# Patient Record
Sex: Male | Born: 1935 | Race: White | Hispanic: No | Marital: Married | Smoking: Former smoker
Health system: Southern US, Community
[De-identification: ages and names within clinical notes are randomized; demographics above are authoritative.]

## PROBLEM LIST (undated history)

## (undated) DIAGNOSIS — F0390 Unspecified dementia without behavioral disturbance: Secondary | ICD-10-CM

## (undated) DIAGNOSIS — I639 Cerebral infarction, unspecified: Secondary | ICD-10-CM

---

## 1998-02-26 ENCOUNTER — Other Ambulatory Visit: Admission: RE | Admit: 1998-02-26 | Discharge: 1998-02-26 | Payer: Self-pay | Admitting: Otolaryngology

## 1998-03-30 ENCOUNTER — Encounter: Payer: Self-pay | Admitting: Interventional Cardiology

## 1998-03-30 ENCOUNTER — Inpatient Hospital Stay (HOSPITAL_COMMUNITY): Admission: AD | Admit: 1998-03-30 | Discharge: 1998-04-01 | Payer: Self-pay | Admitting: Interventional Cardiology

## 1998-05-06 ENCOUNTER — Inpatient Hospital Stay (HOSPITAL_COMMUNITY): Admission: RE | Admit: 1998-05-06 | Discharge: 1998-05-08 | Payer: Self-pay | Admitting: Otolaryngology

## 1998-07-13 ENCOUNTER — Ambulatory Visit (HOSPITAL_COMMUNITY): Admission: RE | Admit: 1998-07-13 | Discharge: 1998-07-13 | Payer: Self-pay | Admitting: Endocrinology

## 1998-07-16 ENCOUNTER — Encounter: Payer: Self-pay | Admitting: Endocrinology

## 1998-12-11 ENCOUNTER — Ambulatory Visit (HOSPITAL_COMMUNITY): Admission: RE | Admit: 1998-12-11 | Discharge: 1998-12-11 | Payer: Self-pay | Admitting: Endocrinology

## 1998-12-14 ENCOUNTER — Encounter: Payer: Self-pay | Admitting: Endocrinology

## 1999-01-25 ENCOUNTER — Encounter (HOSPITAL_BASED_OUTPATIENT_CLINIC_OR_DEPARTMENT_OTHER): Payer: Self-pay | Admitting: Internal Medicine

## 1999-01-25 ENCOUNTER — Ambulatory Visit (HOSPITAL_COMMUNITY): Admission: RE | Admit: 1999-01-25 | Discharge: 1999-01-25 | Payer: Self-pay | Admitting: Internal Medicine

## 2011-07-06 DIAGNOSIS — M5030 Other cervical disc degeneration, unspecified cervical region: Secondary | ICD-10-CM | POA: Diagnosis not present

## 2011-07-06 DIAGNOSIS — E119 Type 2 diabetes mellitus without complications: Secondary | ICD-10-CM | POA: Diagnosis not present

## 2011-07-06 DIAGNOSIS — E785 Hyperlipidemia, unspecified: Secondary | ICD-10-CM | POA: Diagnosis not present

## 2011-07-06 DIAGNOSIS — E039 Hypothyroidism, unspecified: Secondary | ICD-10-CM | POA: Diagnosis not present

## 2011-07-06 DIAGNOSIS — Z6828 Body mass index (BMI) 28.0-28.9, adult: Secondary | ICD-10-CM | POA: Diagnosis not present

## 2011-07-06 DIAGNOSIS — D509 Iron deficiency anemia, unspecified: Secondary | ICD-10-CM | POA: Diagnosis not present

## 2011-07-06 DIAGNOSIS — Z79899 Other long term (current) drug therapy: Secondary | ICD-10-CM | POA: Diagnosis not present

## 2011-10-10 DIAGNOSIS — E785 Hyperlipidemia, unspecified: Secondary | ICD-10-CM | POA: Diagnosis not present

## 2011-10-10 DIAGNOSIS — D509 Iron deficiency anemia, unspecified: Secondary | ICD-10-CM | POA: Diagnosis not present

## 2011-10-10 DIAGNOSIS — E119 Type 2 diabetes mellitus without complications: Secondary | ICD-10-CM | POA: Diagnosis not present

## 2011-10-10 DIAGNOSIS — E039 Hypothyroidism, unspecified: Secondary | ICD-10-CM | POA: Diagnosis not present

## 2011-10-10 DIAGNOSIS — Z79899 Other long term (current) drug therapy: Secondary | ICD-10-CM | POA: Diagnosis not present

## 2011-10-10 DIAGNOSIS — Z6829 Body mass index (BMI) 29.0-29.9, adult: Secondary | ICD-10-CM | POA: Diagnosis not present

## 2011-10-10 DIAGNOSIS — D689 Coagulation defect, unspecified: Secondary | ICD-10-CM | POA: Diagnosis not present

## 2012-01-20 DIAGNOSIS — Z6829 Body mass index (BMI) 29.0-29.9, adult: Secondary | ICD-10-CM | POA: Diagnosis not present

## 2012-01-20 DIAGNOSIS — E039 Hypothyroidism, unspecified: Secondary | ICD-10-CM | POA: Diagnosis not present

## 2012-01-20 DIAGNOSIS — D509 Iron deficiency anemia, unspecified: Secondary | ICD-10-CM | POA: Diagnosis not present

## 2012-01-20 DIAGNOSIS — E785 Hyperlipidemia, unspecified: Secondary | ICD-10-CM | POA: Diagnosis not present

## 2012-01-20 DIAGNOSIS — E119 Type 2 diabetes mellitus without complications: Secondary | ICD-10-CM | POA: Diagnosis not present

## 2012-05-18 DIAGNOSIS — D509 Iron deficiency anemia, unspecified: Secondary | ICD-10-CM | POA: Diagnosis not present

## 2012-05-18 DIAGNOSIS — E119 Type 2 diabetes mellitus without complications: Secondary | ICD-10-CM | POA: Diagnosis not present

## 2012-05-18 DIAGNOSIS — Z6829 Body mass index (BMI) 29.0-29.9, adult: Secondary | ICD-10-CM | POA: Diagnosis not present

## 2012-05-18 DIAGNOSIS — E039 Hypothyroidism, unspecified: Secondary | ICD-10-CM | POA: Diagnosis not present

## 2012-05-18 DIAGNOSIS — E785 Hyperlipidemia, unspecified: Secondary | ICD-10-CM | POA: Diagnosis not present

## 2012-05-28 DIAGNOSIS — Z23 Encounter for immunization: Secondary | ICD-10-CM | POA: Diagnosis not present

## 2012-08-23 DIAGNOSIS — D509 Iron deficiency anemia, unspecified: Secondary | ICD-10-CM | POA: Diagnosis not present

## 2012-08-23 DIAGNOSIS — E785 Hyperlipidemia, unspecified: Secondary | ICD-10-CM | POA: Diagnosis not present

## 2012-08-23 DIAGNOSIS — E039 Hypothyroidism, unspecified: Secondary | ICD-10-CM | POA: Diagnosis not present

## 2012-11-30 DIAGNOSIS — Z1331 Encounter for screening for depression: Secondary | ICD-10-CM | POA: Diagnosis not present

## 2012-11-30 DIAGNOSIS — D509 Iron deficiency anemia, unspecified: Secondary | ICD-10-CM | POA: Diagnosis not present

## 2012-11-30 DIAGNOSIS — Z6827 Body mass index (BMI) 27.0-27.9, adult: Secondary | ICD-10-CM | POA: Diagnosis not present

## 2012-11-30 DIAGNOSIS — E119 Type 2 diabetes mellitus without complications: Secondary | ICD-10-CM | POA: Diagnosis not present

## 2012-11-30 DIAGNOSIS — Z9181 History of falling: Secondary | ICD-10-CM | POA: Diagnosis not present

## 2012-11-30 DIAGNOSIS — E039 Hypothyroidism, unspecified: Secondary | ICD-10-CM | POA: Diagnosis not present

## 2012-11-30 DIAGNOSIS — E785 Hyperlipidemia, unspecified: Secondary | ICD-10-CM | POA: Diagnosis not present

## 2013-03-12 DIAGNOSIS — D509 Iron deficiency anemia, unspecified: Secondary | ICD-10-CM | POA: Diagnosis not present

## 2013-03-12 DIAGNOSIS — I498 Other specified cardiac arrhythmias: Secondary | ICD-10-CM | POA: Diagnosis not present

## 2013-03-12 DIAGNOSIS — Z6828 Body mass index (BMI) 28.0-28.9, adult: Secondary | ICD-10-CM | POA: Diagnosis not present

## 2013-03-12 DIAGNOSIS — E119 Type 2 diabetes mellitus without complications: Secondary | ICD-10-CM | POA: Diagnosis not present

## 2013-03-12 DIAGNOSIS — E039 Hypothyroidism, unspecified: Secondary | ICD-10-CM | POA: Diagnosis not present

## 2013-03-12 DIAGNOSIS — E785 Hyperlipidemia, unspecified: Secondary | ICD-10-CM | POA: Diagnosis not present

## 2013-03-25 DIAGNOSIS — R9431 Abnormal electrocardiogram [ECG] [EKG]: Secondary | ICD-10-CM | POA: Diagnosis not present

## 2013-03-25 DIAGNOSIS — E039 Hypothyroidism, unspecified: Secondary | ICD-10-CM | POA: Diagnosis not present

## 2013-03-25 DIAGNOSIS — E119 Type 2 diabetes mellitus without complications: Secondary | ICD-10-CM | POA: Diagnosis not present

## 2013-03-25 DIAGNOSIS — Z87891 Personal history of nicotine dependence: Secondary | ICD-10-CM | POA: Diagnosis not present

## 2013-03-25 DIAGNOSIS — Z85850 Personal history of malignant neoplasm of thyroid: Secondary | ICD-10-CM | POA: Diagnosis not present

## 2013-03-25 DIAGNOSIS — E785 Hyperlipidemia, unspecified: Secondary | ICD-10-CM | POA: Diagnosis not present

## 2013-03-25 DIAGNOSIS — I10 Essential (primary) hypertension: Secondary | ICD-10-CM | POA: Diagnosis not present

## 2013-03-25 DIAGNOSIS — C73 Malignant neoplasm of thyroid gland: Secondary | ICD-10-CM | POA: Diagnosis not present

## 2013-04-01 DIAGNOSIS — R9431 Abnormal electrocardiogram [ECG] [EKG]: Secondary | ICD-10-CM | POA: Diagnosis not present

## 2013-04-24 DIAGNOSIS — E119 Type 2 diabetes mellitus without complications: Secondary | ICD-10-CM | POA: Diagnosis not present

## 2013-04-24 DIAGNOSIS — I10 Essential (primary) hypertension: Secondary | ICD-10-CM | POA: Diagnosis not present

## 2013-04-24 DIAGNOSIS — E785 Hyperlipidemia, unspecified: Secondary | ICD-10-CM | POA: Diagnosis not present

## 2013-04-25 DIAGNOSIS — Z23 Encounter for immunization: Secondary | ICD-10-CM | POA: Diagnosis not present

## 2013-05-25 DIAGNOSIS — G4733 Obstructive sleep apnea (adult) (pediatric): Secondary | ICD-10-CM | POA: Diagnosis not present

## 2013-06-25 DIAGNOSIS — E785 Hyperlipidemia, unspecified: Secondary | ICD-10-CM | POA: Diagnosis not present

## 2013-06-25 DIAGNOSIS — E119 Type 2 diabetes mellitus without complications: Secondary | ICD-10-CM | POA: Diagnosis not present

## 2013-06-25 DIAGNOSIS — D509 Iron deficiency anemia, unspecified: Secondary | ICD-10-CM | POA: Diagnosis not present

## 2013-06-25 DIAGNOSIS — E039 Hypothyroidism, unspecified: Secondary | ICD-10-CM | POA: Diagnosis not present

## 2013-06-25 DIAGNOSIS — Z6828 Body mass index (BMI) 28.0-28.9, adult: Secondary | ICD-10-CM | POA: Diagnosis not present

## 2013-08-19 DIAGNOSIS — E890 Postprocedural hypothyroidism: Secondary | ICD-10-CM | POA: Diagnosis not present

## 2013-08-19 DIAGNOSIS — J209 Acute bronchitis, unspecified: Secondary | ICD-10-CM | POA: Diagnosis not present

## 2013-08-19 DIAGNOSIS — Z6828 Body mass index (BMI) 28.0-28.9, adult: Secondary | ICD-10-CM | POA: Diagnosis not present

## 2013-08-22 DIAGNOSIS — Z6828 Body mass index (BMI) 28.0-28.9, adult: Secondary | ICD-10-CM | POA: Diagnosis not present

## 2013-08-22 DIAGNOSIS — D509 Iron deficiency anemia, unspecified: Secondary | ICD-10-CM | POA: Diagnosis not present

## 2013-08-22 DIAGNOSIS — E785 Hyperlipidemia, unspecified: Secondary | ICD-10-CM | POA: Diagnosis not present

## 2013-08-22 DIAGNOSIS — E119 Type 2 diabetes mellitus without complications: Secondary | ICD-10-CM | POA: Diagnosis not present

## 2013-08-22 DIAGNOSIS — E890 Postprocedural hypothyroidism: Secondary | ICD-10-CM | POA: Diagnosis not present

## 2013-11-29 DIAGNOSIS — D509 Iron deficiency anemia, unspecified: Secondary | ICD-10-CM | POA: Diagnosis not present

## 2013-11-29 DIAGNOSIS — E785 Hyperlipidemia, unspecified: Secondary | ICD-10-CM | POA: Diagnosis not present

## 2013-11-29 DIAGNOSIS — E119 Type 2 diabetes mellitus without complications: Secondary | ICD-10-CM | POA: Diagnosis not present

## 2013-11-29 DIAGNOSIS — E890 Postprocedural hypothyroidism: Secondary | ICD-10-CM | POA: Diagnosis not present

## 2013-11-29 DIAGNOSIS — Z6827 Body mass index (BMI) 27.0-27.9, adult: Secondary | ICD-10-CM | POA: Diagnosis not present

## 2014-02-27 DIAGNOSIS — H40019 Open angle with borderline findings, low risk, unspecified eye: Secondary | ICD-10-CM | POA: Diagnosis not present

## 2014-03-05 DIAGNOSIS — D509 Iron deficiency anemia, unspecified: Secondary | ICD-10-CM | POA: Diagnosis not present

## 2014-03-05 DIAGNOSIS — E890 Postprocedural hypothyroidism: Secondary | ICD-10-CM | POA: Diagnosis not present

## 2014-03-05 DIAGNOSIS — Z6827 Body mass index (BMI) 27.0-27.9, adult: Secondary | ICD-10-CM | POA: Diagnosis not present

## 2014-03-05 DIAGNOSIS — Z9181 History of falling: Secondary | ICD-10-CM | POA: Diagnosis not present

## 2014-03-05 DIAGNOSIS — E119 Type 2 diabetes mellitus without complications: Secondary | ICD-10-CM | POA: Diagnosis not present

## 2014-03-05 DIAGNOSIS — E785 Hyperlipidemia, unspecified: Secondary | ICD-10-CM | POA: Diagnosis not present

## 2014-04-25 DIAGNOSIS — Z23 Encounter for immunization: Secondary | ICD-10-CM | POA: Diagnosis not present

## 2014-06-06 DIAGNOSIS — E119 Type 2 diabetes mellitus without complications: Secondary | ICD-10-CM | POA: Diagnosis not present

## 2014-06-06 DIAGNOSIS — E890 Postprocedural hypothyroidism: Secondary | ICD-10-CM | POA: Diagnosis not present

## 2014-06-06 DIAGNOSIS — Z6827 Body mass index (BMI) 27.0-27.9, adult: Secondary | ICD-10-CM | POA: Diagnosis not present

## 2014-06-06 DIAGNOSIS — E785 Hyperlipidemia, unspecified: Secondary | ICD-10-CM | POA: Diagnosis not present

## 2014-06-06 DIAGNOSIS — E611 Iron deficiency: Secondary | ICD-10-CM | POA: Diagnosis not present

## 2014-09-09 DIAGNOSIS — E785 Hyperlipidemia, unspecified: Secondary | ICD-10-CM | POA: Diagnosis not present

## 2014-09-09 DIAGNOSIS — E119 Type 2 diabetes mellitus without complications: Secondary | ICD-10-CM | POA: Diagnosis not present

## 2014-09-09 DIAGNOSIS — E611 Iron deficiency: Secondary | ICD-10-CM | POA: Diagnosis not present

## 2014-09-09 DIAGNOSIS — E890 Postprocedural hypothyroidism: Secondary | ICD-10-CM | POA: Diagnosis not present

## 2014-09-09 DIAGNOSIS — Z6828 Body mass index (BMI) 28.0-28.9, adult: Secondary | ICD-10-CM | POA: Diagnosis not present

## 2014-12-15 DIAGNOSIS — Z6828 Body mass index (BMI) 28.0-28.9, adult: Secondary | ICD-10-CM | POA: Diagnosis not present

## 2014-12-15 DIAGNOSIS — E119 Type 2 diabetes mellitus without complications: Secondary | ICD-10-CM | POA: Diagnosis not present

## 2014-12-15 DIAGNOSIS — E890 Postprocedural hypothyroidism: Secondary | ICD-10-CM | POA: Diagnosis not present

## 2014-12-15 DIAGNOSIS — E611 Iron deficiency: Secondary | ICD-10-CM | POA: Diagnosis not present

## 2014-12-15 DIAGNOSIS — E785 Hyperlipidemia, unspecified: Secondary | ICD-10-CM | POA: Diagnosis not present

## 2015-03-19 DIAGNOSIS — E890 Postprocedural hypothyroidism: Secondary | ICD-10-CM | POA: Diagnosis not present

## 2015-03-19 DIAGNOSIS — D509 Iron deficiency anemia, unspecified: Secondary | ICD-10-CM | POA: Diagnosis not present

## 2015-03-19 DIAGNOSIS — E119 Type 2 diabetes mellitus without complications: Secondary | ICD-10-CM | POA: Diagnosis not present

## 2015-03-19 DIAGNOSIS — H938X2 Other specified disorders of left ear: Secondary | ICD-10-CM | POA: Diagnosis not present

## 2015-03-19 DIAGNOSIS — E785 Hyperlipidemia, unspecified: Secondary | ICD-10-CM | POA: Diagnosis not present

## 2015-03-19 DIAGNOSIS — Z23 Encounter for immunization: Secondary | ICD-10-CM | POA: Diagnosis not present

## 2015-03-19 DIAGNOSIS — H6122 Impacted cerumen, left ear: Secondary | ICD-10-CM | POA: Diagnosis not present

## 2015-05-29 DIAGNOSIS — M25512 Pain in left shoulder: Secondary | ICD-10-CM | POA: Diagnosis not present

## 2015-06-06 DIAGNOSIS — M25512 Pain in left shoulder: Secondary | ICD-10-CM | POA: Diagnosis not present

## 2015-06-06 DIAGNOSIS — M75102 Unspecified rotator cuff tear or rupture of left shoulder, not specified as traumatic: Secondary | ICD-10-CM | POA: Diagnosis not present

## 2015-06-10 DIAGNOSIS — M12812 Other specific arthropathies, not elsewhere classified, left shoulder: Secondary | ICD-10-CM | POA: Diagnosis not present

## 2015-06-10 DIAGNOSIS — M25512 Pain in left shoulder: Secondary | ICD-10-CM | POA: Diagnosis not present

## 2015-06-23 DIAGNOSIS — D509 Iron deficiency anemia, unspecified: Secondary | ICD-10-CM | POA: Diagnosis not present

## 2015-06-23 DIAGNOSIS — E890 Postprocedural hypothyroidism: Secondary | ICD-10-CM | POA: Diagnosis not present

## 2015-06-23 DIAGNOSIS — Z9181 History of falling: Secondary | ICD-10-CM | POA: Diagnosis not present

## 2015-06-23 DIAGNOSIS — E119 Type 2 diabetes mellitus without complications: Secondary | ICD-10-CM | POA: Diagnosis not present

## 2015-06-23 DIAGNOSIS — Z23 Encounter for immunization: Secondary | ICD-10-CM | POA: Diagnosis not present

## 2015-06-23 DIAGNOSIS — E785 Hyperlipidemia, unspecified: Secondary | ICD-10-CM | POA: Diagnosis not present

## 2015-06-23 DIAGNOSIS — Z1389 Encounter for screening for other disorder: Secondary | ICD-10-CM | POA: Diagnosis not present

## 2015-09-22 DIAGNOSIS — E785 Hyperlipidemia, unspecified: Secondary | ICD-10-CM | POA: Diagnosis not present

## 2015-09-22 DIAGNOSIS — D509 Iron deficiency anemia, unspecified: Secondary | ICD-10-CM | POA: Diagnosis not present

## 2015-09-22 DIAGNOSIS — E663 Overweight: Secondary | ICD-10-CM | POA: Diagnosis not present

## 2015-09-22 DIAGNOSIS — E890 Postprocedural hypothyroidism: Secondary | ICD-10-CM | POA: Diagnosis not present

## 2015-09-22 DIAGNOSIS — Z6828 Body mass index (BMI) 28.0-28.9, adult: Secondary | ICD-10-CM | POA: Diagnosis not present

## 2015-09-22 DIAGNOSIS — E119 Type 2 diabetes mellitus without complications: Secondary | ICD-10-CM | POA: Diagnosis not present

## 2015-09-22 DIAGNOSIS — H6123 Impacted cerumen, bilateral: Secondary | ICD-10-CM | POA: Diagnosis not present

## 2015-11-03 DIAGNOSIS — H40013 Open angle with borderline findings, low risk, bilateral: Secondary | ICD-10-CM | POA: Diagnosis not present

## 2015-11-03 DIAGNOSIS — H5203 Hypermetropia, bilateral: Secondary | ICD-10-CM | POA: Diagnosis not present

## 2015-12-23 DIAGNOSIS — E785 Hyperlipidemia, unspecified: Secondary | ICD-10-CM | POA: Diagnosis not present

## 2015-12-23 DIAGNOSIS — D509 Iron deficiency anemia, unspecified: Secondary | ICD-10-CM | POA: Diagnosis not present

## 2015-12-23 DIAGNOSIS — E890 Postprocedural hypothyroidism: Secondary | ICD-10-CM | POA: Diagnosis not present

## 2015-12-23 DIAGNOSIS — Z6827 Body mass index (BMI) 27.0-27.9, adult: Secondary | ICD-10-CM | POA: Diagnosis not present

## 2015-12-23 DIAGNOSIS — E119 Type 2 diabetes mellitus without complications: Secondary | ICD-10-CM | POA: Diagnosis not present

## 2016-01-22 DIAGNOSIS — E890 Postprocedural hypothyroidism: Secondary | ICD-10-CM | POA: Diagnosis not present

## 2016-03-29 DIAGNOSIS — R0602 Shortness of breath: Secondary | ICD-10-CM | POA: Diagnosis not present

## 2016-03-29 DIAGNOSIS — E785 Hyperlipidemia, unspecified: Secondary | ICD-10-CM | POA: Diagnosis not present

## 2016-03-29 DIAGNOSIS — Z6828 Body mass index (BMI) 28.0-28.9, adult: Secondary | ICD-10-CM | POA: Diagnosis not present

## 2016-03-29 DIAGNOSIS — I700 Atherosclerosis of aorta: Secondary | ICD-10-CM | POA: Diagnosis not present

## 2016-03-29 DIAGNOSIS — R05 Cough: Secondary | ICD-10-CM | POA: Diagnosis not present

## 2016-03-29 DIAGNOSIS — K449 Diaphragmatic hernia without obstruction or gangrene: Secondary | ICD-10-CM | POA: Diagnosis not present

## 2016-03-29 DIAGNOSIS — E119 Type 2 diabetes mellitus without complications: Secondary | ICD-10-CM | POA: Diagnosis not present

## 2016-03-29 DIAGNOSIS — J90 Pleural effusion, not elsewhere classified: Secondary | ICD-10-CM | POA: Diagnosis not present

## 2016-03-29 DIAGNOSIS — I509 Heart failure, unspecified: Secondary | ICD-10-CM | POA: Diagnosis not present

## 2016-03-29 DIAGNOSIS — J449 Chronic obstructive pulmonary disease, unspecified: Secondary | ICD-10-CM | POA: Diagnosis not present

## 2016-03-29 DIAGNOSIS — D509 Iron deficiency anemia, unspecified: Secondary | ICD-10-CM | POA: Diagnosis not present

## 2016-03-29 DIAGNOSIS — Z23 Encounter for immunization: Secondary | ICD-10-CM | POA: Diagnosis not present

## 2016-03-29 DIAGNOSIS — E890 Postprocedural hypothyroidism: Secondary | ICD-10-CM | POA: Diagnosis not present

## 2016-03-29 DIAGNOSIS — G309 Alzheimer's disease, unspecified: Secondary | ICD-10-CM | POA: Diagnosis not present

## 2016-03-29 DIAGNOSIS — E663 Overweight: Secondary | ICD-10-CM | POA: Diagnosis not present

## 2016-03-30 DIAGNOSIS — M12812 Other specific arthropathies, not elsewhere classified, left shoulder: Secondary | ICD-10-CM | POA: Diagnosis not present

## 2016-03-30 DIAGNOSIS — M25512 Pain in left shoulder: Secondary | ICD-10-CM | POA: Diagnosis not present

## 2016-04-06 DIAGNOSIS — G309 Alzheimer's disease, unspecified: Secondary | ICD-10-CM | POA: Diagnosis not present

## 2016-04-06 DIAGNOSIS — I5031 Acute diastolic (congestive) heart failure: Secondary | ICD-10-CM | POA: Diagnosis not present

## 2016-04-06 DIAGNOSIS — I639 Cerebral infarction, unspecified: Secondary | ICD-10-CM | POA: Diagnosis not present

## 2016-04-19 DIAGNOSIS — J4520 Mild intermittent asthma, uncomplicated: Secondary | ICD-10-CM | POA: Diagnosis not present

## 2016-04-19 DIAGNOSIS — R5383 Other fatigue: Secondary | ICD-10-CM | POA: Diagnosis not present

## 2016-04-19 DIAGNOSIS — G4733 Obstructive sleep apnea (adult) (pediatric): Secondary | ICD-10-CM | POA: Diagnosis not present

## 2016-04-20 DIAGNOSIS — G4733 Obstructive sleep apnea (adult) (pediatric): Secondary | ICD-10-CM | POA: Diagnosis not present

## 2016-04-25 DIAGNOSIS — G4733 Obstructive sleep apnea (adult) (pediatric): Secondary | ICD-10-CM | POA: Diagnosis not present

## 2016-04-25 DIAGNOSIS — R5383 Other fatigue: Secondary | ICD-10-CM | POA: Diagnosis not present

## 2016-04-25 DIAGNOSIS — J4520 Mild intermittent asthma, uncomplicated: Secondary | ICD-10-CM | POA: Diagnosis not present

## 2016-04-26 DIAGNOSIS — I5022 Chronic systolic (congestive) heart failure: Secondary | ICD-10-CM | POA: Diagnosis not present

## 2016-04-26 DIAGNOSIS — E559 Vitamin D deficiency, unspecified: Secondary | ICD-10-CM | POA: Diagnosis not present

## 2016-04-26 DIAGNOSIS — I639 Cerebral infarction, unspecified: Secondary | ICD-10-CM | POA: Diagnosis not present

## 2016-04-26 DIAGNOSIS — G309 Alzheimer's disease, unspecified: Secondary | ICD-10-CM | POA: Diagnosis not present

## 2016-04-26 DIAGNOSIS — I4891 Unspecified atrial fibrillation: Secondary | ICD-10-CM | POA: Diagnosis not present

## 2016-04-26 DIAGNOSIS — K449 Diaphragmatic hernia without obstruction or gangrene: Secondary | ICD-10-CM | POA: Diagnosis not present

## 2016-04-26 DIAGNOSIS — I619 Nontraumatic intracerebral hemorrhage, unspecified: Secondary | ICD-10-CM | POA: Diagnosis not present

## 2016-04-26 DIAGNOSIS — G4733 Obstructive sleep apnea (adult) (pediatric): Secondary | ICD-10-CM | POA: Diagnosis not present

## 2016-05-03 DIAGNOSIS — I509 Heart failure, unspecified: Secondary | ICD-10-CM | POA: Diagnosis not present

## 2016-05-03 DIAGNOSIS — I619 Nontraumatic intracerebral hemorrhage, unspecified: Secondary | ICD-10-CM | POA: Diagnosis not present

## 2016-05-03 DIAGNOSIS — I4891 Unspecified atrial fibrillation: Secondary | ICD-10-CM | POA: Diagnosis not present

## 2016-05-03 DIAGNOSIS — E538 Deficiency of other specified B group vitamins: Secondary | ICD-10-CM | POA: Diagnosis not present

## 2016-05-03 DIAGNOSIS — G4733 Obstructive sleep apnea (adult) (pediatric): Secondary | ICD-10-CM | POA: Diagnosis not present

## 2016-05-03 DIAGNOSIS — R413 Other amnesia: Secondary | ICD-10-CM | POA: Diagnosis not present

## 2016-05-16 DIAGNOSIS — I619 Nontraumatic intracerebral hemorrhage, unspecified: Secondary | ICD-10-CM | POA: Diagnosis not present

## 2016-05-16 DIAGNOSIS — I63233 Cerebral infarction due to unspecified occlusion or stenosis of bilateral carotid arteries: Secondary | ICD-10-CM | POA: Diagnosis not present

## 2016-05-16 DIAGNOSIS — I6523 Occlusion and stenosis of bilateral carotid arteries: Secondary | ICD-10-CM | POA: Diagnosis not present

## 2016-07-20 DIAGNOSIS — M25512 Pain in left shoulder: Secondary | ICD-10-CM | POA: Diagnosis not present

## 2016-07-20 DIAGNOSIS — M12812 Other specific arthropathies, not elsewhere classified, left shoulder: Secondary | ICD-10-CM | POA: Diagnosis not present

## 2016-07-27 DIAGNOSIS — D509 Iron deficiency anemia, unspecified: Secondary | ICD-10-CM | POA: Diagnosis not present

## 2016-07-27 DIAGNOSIS — E119 Type 2 diabetes mellitus without complications: Secondary | ICD-10-CM | POA: Diagnosis not present

## 2016-07-27 DIAGNOSIS — I639 Cerebral infarction, unspecified: Secondary | ICD-10-CM | POA: Diagnosis not present

## 2016-07-27 DIAGNOSIS — I4891 Unspecified atrial fibrillation: Secondary | ICD-10-CM | POA: Diagnosis not present

## 2016-07-27 DIAGNOSIS — Z1389 Encounter for screening for other disorder: Secondary | ICD-10-CM | POA: Diagnosis not present

## 2016-07-27 DIAGNOSIS — E890 Postprocedural hypothyroidism: Secondary | ICD-10-CM | POA: Diagnosis not present

## 2016-07-27 DIAGNOSIS — E785 Hyperlipidemia, unspecified: Secondary | ICD-10-CM | POA: Diagnosis not present

## 2016-07-27 DIAGNOSIS — Z9181 History of falling: Secondary | ICD-10-CM | POA: Diagnosis not present

## 2016-08-09 DIAGNOSIS — J4520 Mild intermittent asthma, uncomplicated: Secondary | ICD-10-CM | POA: Diagnosis not present

## 2016-08-09 DIAGNOSIS — R5383 Other fatigue: Secondary | ICD-10-CM | POA: Diagnosis not present

## 2016-08-09 DIAGNOSIS — G4733 Obstructive sleep apnea (adult) (pediatric): Secondary | ICD-10-CM | POA: Diagnosis not present

## 2016-09-01 DIAGNOSIS — E890 Postprocedural hypothyroidism: Secondary | ICD-10-CM | POA: Diagnosis not present

## 2016-09-16 DIAGNOSIS — R4182 Altered mental status, unspecified: Secondary | ICD-10-CM | POA: Diagnosis not present

## 2016-09-16 DIAGNOSIS — F0390 Unspecified dementia without behavioral disturbance: Secondary | ICD-10-CM | POA: Diagnosis not present

## 2016-09-16 DIAGNOSIS — I517 Cardiomegaly: Secondary | ICD-10-CM | POA: Diagnosis not present

## 2016-09-20 ENCOUNTER — Emergency Department (HOSPITAL_COMMUNITY): Payer: Medicare Other

## 2016-09-20 ENCOUNTER — Inpatient Hospital Stay (HOSPITAL_COMMUNITY): Payer: Medicare Other

## 2016-09-20 ENCOUNTER — Inpatient Hospital Stay (HOSPITAL_COMMUNITY)
Admission: EM | Admit: 2016-09-20 | Discharge: 2016-09-22 | DRG: 101 | Disposition: A | Discharge status: Skilled Nursing Facility | Payer: Medicare Other | Attending: Internal Medicine | Admitting: Internal Medicine

## 2016-09-20 ENCOUNTER — Encounter (HOSPITAL_COMMUNITY): Payer: Self-pay

## 2016-09-20 DIAGNOSIS — Z7984 Long term (current) use of oral hypoglycemic drugs: Secondary | ICD-10-CM

## 2016-09-20 DIAGNOSIS — Z8639 Personal history of other endocrine, nutritional and metabolic disease: Secondary | ICD-10-CM | POA: Diagnosis not present

## 2016-09-20 DIAGNOSIS — I482 Chronic atrial fibrillation, unspecified: Secondary | ICD-10-CM | POA: Diagnosis present

## 2016-09-20 DIAGNOSIS — Z515 Encounter for palliative care: Secondary | ICD-10-CM | POA: Diagnosis not present

## 2016-09-20 DIAGNOSIS — R4781 Slurred speech: Secondary | ICD-10-CM

## 2016-09-20 DIAGNOSIS — R748 Abnormal levels of other serum enzymes: Secondary | ICD-10-CM | POA: Diagnosis present

## 2016-09-20 DIAGNOSIS — Z8673 Personal history of transient ischemic attack (TIA), and cerebral infarction without residual deficits: Secondary | ICD-10-CM

## 2016-09-20 DIAGNOSIS — I517 Cardiomegaly: Secondary | ICD-10-CM | POA: Diagnosis not present

## 2016-09-20 DIAGNOSIS — I6789 Other cerebrovascular disease: Secondary | ICD-10-CM | POA: Diagnosis not present

## 2016-09-20 DIAGNOSIS — Z87891 Personal history of nicotine dependence: Secondary | ICD-10-CM | POA: Diagnosis not present

## 2016-09-20 DIAGNOSIS — W19XXXA Unspecified fall, initial encounter: Secondary | ICD-10-CM

## 2016-09-20 DIAGNOSIS — G8194 Hemiplegia, unspecified affecting left nondominant side: Secondary | ICD-10-CM | POA: Diagnosis not present

## 2016-09-20 DIAGNOSIS — Z87898 Personal history of other specified conditions: Secondary | ICD-10-CM | POA: Diagnosis not present

## 2016-09-20 DIAGNOSIS — S199XXA Unspecified injury of neck, initial encounter: Secondary | ICD-10-CM | POA: Diagnosis not present

## 2016-09-20 DIAGNOSIS — E876 Hypokalemia: Secondary | ICD-10-CM | POA: Diagnosis present

## 2016-09-20 DIAGNOSIS — T1490XA Injury, unspecified, initial encounter: Secondary | ICD-10-CM | POA: Diagnosis present

## 2016-09-20 DIAGNOSIS — E119 Type 2 diabetes mellitus without complications: Secondary | ICD-10-CM | POA: Diagnosis present

## 2016-09-20 DIAGNOSIS — R569 Unspecified convulsions: Principal | ICD-10-CM | POA: Diagnosis present

## 2016-09-20 DIAGNOSIS — G9340 Encephalopathy, unspecified: Secondary | ICD-10-CM | POA: Diagnosis not present

## 2016-09-20 DIAGNOSIS — M6282 Rhabdomyolysis: Secondary | ICD-10-CM | POA: Diagnosis present

## 2016-09-20 DIAGNOSIS — E860 Dehydration: Secondary | ICD-10-CM | POA: Diagnosis present

## 2016-09-20 DIAGNOSIS — R471 Dysarthria and anarthria: Secondary | ICD-10-CM | POA: Diagnosis not present

## 2016-09-20 DIAGNOSIS — E118 Type 2 diabetes mellitus with unspecified complications: Secondary | ICD-10-CM | POA: Diagnosis not present

## 2016-09-20 DIAGNOSIS — S3993XA Unspecified injury of pelvis, initial encounter: Secondary | ICD-10-CM | POA: Diagnosis not present

## 2016-09-20 DIAGNOSIS — R451 Restlessness and agitation: Secondary | ICD-10-CM

## 2016-09-20 DIAGNOSIS — K117 Disturbances of salivary secretion: Secondary | ICD-10-CM

## 2016-09-20 DIAGNOSIS — F0390 Unspecified dementia without behavioral disturbance: Secondary | ICD-10-CM

## 2016-09-20 DIAGNOSIS — Z9181 History of falling: Secondary | ICD-10-CM

## 2016-09-20 DIAGNOSIS — R441 Visual hallucinations: Secondary | ICD-10-CM

## 2016-09-20 DIAGNOSIS — R531 Weakness: Secondary | ICD-10-CM | POA: Diagnosis not present

## 2016-09-20 DIAGNOSIS — Z79899 Other long term (current) drug therapy: Secondary | ICD-10-CM

## 2016-09-20 DIAGNOSIS — F0281 Dementia in other diseases classified elsewhere with behavioral disturbance: Secondary | ICD-10-CM | POA: Diagnosis not present

## 2016-09-20 DIAGNOSIS — Z66 Do not resuscitate: Secondary | ICD-10-CM | POA: Diagnosis present

## 2016-09-20 DIAGNOSIS — S299XXA Unspecified injury of thorax, initial encounter: Secondary | ICD-10-CM | POA: Diagnosis not present

## 2016-09-20 DIAGNOSIS — S0990XA Unspecified injury of head, initial encounter: Secondary | ICD-10-CM | POA: Diagnosis not present

## 2016-09-20 DIAGNOSIS — R4182 Altered mental status, unspecified: Secondary | ICD-10-CM | POA: Diagnosis not present

## 2016-09-20 DIAGNOSIS — Z8679 Personal history of other diseases of the circulatory system: Secondary | ICD-10-CM | POA: Diagnosis not present

## 2016-09-20 DIAGNOSIS — R55 Syncope and collapse: Secondary | ICD-10-CM

## 2016-09-20 HISTORY — DX: Cerebral infarction, unspecified: I63.9

## 2016-09-20 HISTORY — DX: Unspecified dementia, unspecified severity, without behavioral disturbance, psychotic disturbance, mood disturbance, and anxiety: F03.90

## 2016-09-20 LAB — CK: CK TOTAL: 509 U/L — AB (ref 49–397)

## 2016-09-20 LAB — DIFFERENTIAL
BASOS PCT: 0 %
Basophils Absolute: 0 10*3/uL (ref 0.0–0.1)
EOS PCT: 0 %
Eosinophils Absolute: 0 10*3/uL (ref 0.0–0.7)
Lymphocytes Relative: 13 %
Lymphs Abs: 1 10*3/uL (ref 0.7–4.0)
MONO ABS: 0.6 10*3/uL (ref 0.1–1.0)
MONOS PCT: 8 %
Neutro Abs: 5.9 10*3/uL (ref 1.7–7.7)
Neutrophils Relative %: 79 %

## 2016-09-20 LAB — COMPREHENSIVE METABOLIC PANEL
ALT: 12 U/L — AB (ref 17–63)
ANION GAP: 9 (ref 5–15)
AST: 27 U/L (ref 15–41)
Albumin: 3.9 g/dL (ref 3.5–5.0)
Alkaline Phosphatase: 46 U/L (ref 38–126)
BUN: 10 mg/dL (ref 6–20)
CALCIUM: 8.9 mg/dL (ref 8.9–10.3)
CHLORIDE: 104 mmol/L (ref 101–111)
CO2: 29 mmol/L (ref 22–32)
CREATININE: 0.84 mg/dL (ref 0.61–1.24)
Glucose, Bld: 112 mg/dL — ABNORMAL HIGH (ref 65–99)
Potassium: 3.5 mmol/L (ref 3.5–5.1)
SODIUM: 142 mmol/L (ref 135–145)
Total Bilirubin: 1.8 mg/dL — ABNORMAL HIGH (ref 0.3–1.2)
Total Protein: 6.2 g/dL — ABNORMAL LOW (ref 6.5–8.1)

## 2016-09-20 LAB — ETHANOL

## 2016-09-20 LAB — URINALYSIS, ROUTINE W REFLEX MICROSCOPIC
BACTERIA UA: NONE SEEN
BILIRUBIN URINE: NEGATIVE
Glucose, UA: NEGATIVE mg/dL
KETONES UR: 5 mg/dL — AB
Leukocytes, UA: NEGATIVE
NITRITE: NEGATIVE
PROTEIN: 30 mg/dL — AB
SQUAMOUS EPITHELIAL / LPF: NONE SEEN
Specific Gravity, Urine: 1.017 (ref 1.005–1.030)
pH: 6 (ref 5.0–8.0)

## 2016-09-20 LAB — I-STAT CHEM 8, ED
BUN: 11 mg/dL (ref 6–20)
CALCIUM ION: 1.07 mmol/L — AB (ref 1.15–1.40)
CHLORIDE: 100 mmol/L — AB (ref 101–111)
Creatinine, Ser: 0.7 mg/dL (ref 0.61–1.24)
GLUCOSE: 107 mg/dL — AB (ref 65–99)
HCT: 42 % (ref 39.0–52.0)
HEMOGLOBIN: 14.3 g/dL (ref 13.0–17.0)
Potassium: 3.4 mmol/L — ABNORMAL LOW (ref 3.5–5.1)
SODIUM: 142 mmol/L (ref 135–145)
TCO2: 30 mmol/L (ref 0–100)

## 2016-09-20 LAB — CBC
HEMATOCRIT: 43.4 % (ref 39.0–52.0)
Hemoglobin: 14.4 g/dL (ref 13.0–17.0)
MCH: 31.2 pg (ref 26.0–34.0)
MCHC: 33.2 g/dL (ref 30.0–36.0)
MCV: 93.9 fL (ref 78.0–100.0)
PLATELETS: 147 10*3/uL — AB (ref 150–400)
RBC: 4.62 MIL/uL (ref 4.22–5.81)
RDW: 14 % (ref 11.5–15.5)
WBC: 7.5 10*3/uL (ref 4.0–10.5)

## 2016-09-20 LAB — RAPID URINE DRUG SCREEN, HOSP PERFORMED
AMPHETAMINES: NOT DETECTED
BENZODIAZEPINES: NOT DETECTED
Barbiturates: NOT DETECTED
Cocaine: NOT DETECTED
Opiates: NOT DETECTED
Tetrahydrocannabinol: NOT DETECTED

## 2016-09-20 LAB — I-STAT CG4 LACTIC ACID, ED: Lactic Acid, Venous: 1.59 mmol/L (ref 0.5–1.9)

## 2016-09-20 LAB — PROTIME-INR
INR: 1.45
PROTHROMBIN TIME: 17.8 s — AB (ref 11.4–15.2)

## 2016-09-20 LAB — I-STAT TROPONIN, ED: TROPONIN I, POC: 0.04 ng/mL (ref 0.00–0.08)

## 2016-09-20 LAB — CBG MONITORING, ED: Glucose-Capillary: 110 mg/dL — ABNORMAL HIGH (ref 65–99)

## 2016-09-20 LAB — APTT: aPTT: 30 seconds (ref 24–36)

## 2016-09-20 MED ORDER — FAMOTIDINE 20 MG PO TABS: 20.0000 mg | ORAL_TABLET | Freq: Two times a day (BID) | ORAL | Status: DC

## 2016-09-20 MED ORDER — PHENYTOIN 50 MG PO CHEW: 300.0000 mg | CHEWABLE_TABLET | Freq: Every day | ORAL | Status: DC

## 2016-09-20 MED ORDER — HALOPERIDOL LACTATE 5 MG/ML IJ SOLN
2.0000 mg | Freq: Four times a day (QID) | INTRAMUSCULAR | Status: DC | PRN
  Filled 2016-09-20 (×2): qty 1

## 2016-09-20 MED ORDER — ACETAMINOPHEN 325 MG PO TABS: 650.0000 mg | ORAL_TABLET | Freq: Four times a day (QID) | ORAL | Status: DC | PRN

## 2016-09-20 MED ORDER — PHENYTOIN SODIUM EXTENDED 100 MG PO CAPS: 300.0000 mg | ORAL_CAPSULE | Freq: Every day | ORAL | Status: DC

## 2016-09-20 MED ORDER — LORAZEPAM 2 MG/ML IJ SOLN
1.0000 mg | Freq: Four times a day (QID) | INTRAMUSCULAR | Status: DC | PRN
  Administered 2016-09-20: 1 mg via INTRAVENOUS
  Filled 2016-09-20: qty 1

## 2016-09-20 MED ORDER — POLYETHYLENE GLYCOL 3350 17 G PO PACK: 17.0000 g | PACK | Freq: Every day | ORAL | Status: DC | PRN

## 2016-09-20 MED ORDER — PHENYTOIN 50 MG PO CHEW
200.0000 mg | CHEWABLE_TABLET | Freq: Once | ORAL | Status: DC
  Filled 2016-09-20: qty 4

## 2016-09-20 MED ORDER — ACETAMINOPHEN 650 MG RE SUPP: 650.0000 mg | Freq: Four times a day (QID) | RECTAL | Status: DC | PRN

## 2016-09-20 MED ORDER — TAMSULOSIN HCL 0.4 MG PO CAPS
0.4000 mg | ORAL_CAPSULE | Freq: Every evening | ORAL | Status: DC
  Administered 2016-09-20: 0.4 mg via ORAL
  Filled 2016-09-20: qty 1

## 2016-09-20 MED ORDER — FERROUS SULFATE 325 (65 FE) MG PO TABS
325.0000 mg | ORAL_TABLET | Freq: Every day | ORAL | Status: DC
  Administered 2016-09-20: 325 mg via ORAL
  Filled 2016-09-20: qty 1

## 2016-09-20 MED ORDER — PHENYTOIN 50 MG PO CHEW
400.0000 mg | CHEWABLE_TABLET | Freq: Once | ORAL | Status: DC
  Filled 2016-09-20: qty 8

## 2016-09-20 MED ORDER — FUROSEMIDE 20 MG PO TABS
20.0000 mg | ORAL_TABLET | Freq: Every day | ORAL | Status: DC
  Administered 2016-09-20: 20 mg via ORAL
  Filled 2016-09-20: qty 1

## 2016-09-20 MED ORDER — POTASSIUM CHLORIDE IN NACL 20-0.9 MEQ/L-% IV SOLN
INTRAVENOUS | Status: AC
  Administered 2016-09-20: 19:00:00 via INTRAVENOUS
  Filled 2016-09-20 (×2): qty 1000

## 2016-09-20 MED ORDER — SODIUM CHLORIDE 0.9 % IV SOLN
300.0000 mg | Freq: Once | INTRAVENOUS | Status: AC
  Administered 2016-09-21: 300 mg via INTRAVENOUS
  Filled 2016-09-20: qty 6

## 2016-09-20 MED ORDER — PHENYTOIN 50 MG PO CHEW
200.0000 mg | CHEWABLE_TABLET | Freq: Once | ORAL | Status: AC
  Administered 2016-09-20: 200 mg via ORAL
  Filled 2016-09-20 (×2): qty 4

## 2016-09-20 MED ORDER — METFORMIN HCL 500 MG PO TABS: 500.0000 mg | ORAL_TABLET | Freq: Every day | ORAL | Status: DC

## 2016-09-20 MED ORDER — SIMVASTATIN 20 MG PO TABS
20.0000 mg | ORAL_TABLET | Freq: Every evening | ORAL | Status: DC
  Administered 2016-09-20: 20 mg via ORAL
  Filled 2016-09-20: qty 1

## 2016-09-20 MED ORDER — ALBUTEROL SULFATE (2.5 MG/3ML) 0.083% IN NEBU: 2.5000 mg | INHALATION_SOLUTION | Freq: Four times a day (QID) | RESPIRATORY_TRACT | Status: DC | PRN

## 2016-09-20 MED ORDER — ENOXAPARIN SODIUM 30 MG/0.3ML ~~LOC~~ SOLN
30.0000 mg | SUBCUTANEOUS | Status: DC
  Administered 2016-09-20 – 2016-09-21 (×2): 30 mg via SUBCUTANEOUS
  Filled 2016-09-20 (×2): qty 0.3

## 2016-09-20 MED ORDER — LEVOTHYROXINE SODIUM 125 MCG PO TABS
125.0000 ug | ORAL_TABLET | Freq: Every day | ORAL | Status: DC
  Filled 2016-09-20 (×2): qty 1

## 2016-09-20 MED ORDER — ONDANSETRON HCL 4 MG/2ML IJ SOLN: 4.0000 mg | Freq: Four times a day (QID) | INTRAMUSCULAR | Status: DC | PRN

## 2016-09-20 MED ORDER — ONDANSETRON HCL 4 MG PO TABS: 4.0000 mg | ORAL_TABLET | Freq: Four times a day (QID) | ORAL | Status: DC | PRN

## 2016-09-20 NOTE — ED Triage Notes (Signed)
Pt presents for evaluation of stroke like symptoms discovered this AM. Pt lives at home with family, states last seen well at 1800 09/19/16. Pt was found on floor this AM. Reports pt had stroke in October 2017, no deficits reported. Pt has slurred speech and L sided weakness today. Pt with hx of dementia.

## 2016-09-20 NOTE — Consult Note (Signed)
NEURO HOSPITALIST CONSULT NOTE   Requestig physician: Dr. Darl Householder   Reason for Consult: left sided weakness and possible seizure/stroke   History obtained from:  family  HPI:                                                                                                                                          John Reeves is an 83 y.o. male with history of A. fib who is not on anticoagulant secondary to fall risk. Patient also has a history of significant dementia, cerebellar stroke.  Patient lived alone with his wife however his wife is recently and palliative care and unfortunately died last night. Patient does not know this at this time. Patient was last seen normal at approximately 8:00 last night but this morning found in the den on the floor week and difficult to move. Patient was picked up and placed in a chair noted to have bruises all over his arms and skin tears on his hands. As the day has progressed patient has become more alert and able to speak but still has some slurred speech. Family is unsure what the residual or what symptoms patient had on the previous stroke.  Past Medical History:  Diagnosis Date  . Dementia   . Stroke [facility])     History reviewed. No pertinent surgical history.  No family history on file.    Social History:  reports that he has quit smoking. He has never used smokeless tobacco. He reports that he does not drink alcohol. His drug history is not on file.  No Known Allergies  MEDICATIONS:                                                                                                                     No current facility-administered medications for this encounter.    No current outpatient prescriptions on file.      ROS:  History obtained from family  General ROS: negative for - chills,  fatigue, fever, night sweats, weight gain or weight loss Psychological ROS: negative for - behavioral disorder, hallucinations, memory difficulties, mood swings or suicidal ideation Ophthalmic ROS: negative for - blurry vision, double vision, eye pain or loss of vision ENT ROS: negative for - epistaxis, nasal discharge, oral lesions, sore throat, tinnitus or vertigo Allergy and Immunology ROS: negative for - hives or itchy/watery eyes Hematological and Lymphatic ROS: negative for - bleeding problems, bruising or swollen lymph nodes Endocrine ROS: negative for - galactorrhea, hair pattern changes, polydipsia/polyuria or temperature intolerance Respiratory ROS: negative for - cough, hemoptysis, shortness of breath or wheezing Cardiovascular ROS: negative for - chest pain, dyspnea on exertion, edema or irregular heartbeat Gastrointestinal ROS: negative for - abdominal pain, diarrhea, hematemesis, nausea/vomiting or stool incontinence Genito-Urinary ROS: negative for - dysuria, hematuria, incontinence or urinary frequency/urgency Musculoskeletal ROS: negative for - joint swelling or muscular weakness Neurological ROS: as noted in HPI Dermatological ROS: negative for rash and skin lesion changes   Blood pressure (!) 154/98, pulse 79, temperature 98.3 F (36.8 C), temperature source Rectal, resp. rate (!) 23, SpO2 96 %.   Neurologic Examination:                                                                                                      HEENT-  Normocephalic, no lesions, without obvious abnormality.  Normal external eye and conjunctiva.  Normal TM's bilaterally.  Normal auditory canals and external ears. Normal external nose, mucus membranes and septum.  Normal pharynx. Cardiovascular- irregularly irregular rhythm, pulses palpable throughout   Lungs- chest clear, no wheezing, rales, normal symmetric air entry Abdomen- normal findings: bowel sounds normal Extremities- no edema Lymph-no  adenopathy palpable Musculoskeletal-no joint tenderness, deformity or swelling Skin-warm and dry, no hyperpigmentation, vitiligo, or suspicious lesions  Neurological Examination Mental Status: Alert, dysarthric and not oriented to where he is. Patient has some difficulty following commands but is able to follow simple commands. Cranial Nerves: DD:UKGURK fields grossly normal, pupils equal, round, reactive to light and accommodation III,IV, VI: ptosis not present, extra-ocular motions intact bilaterally V,VII: smile has a slight left facial droop however he also is edentulous. facial light touch sensation normal bilaterally VIII: hearing normal bilaterally IX,X: uvula rises symmetrically XI: bilateral shoulder shrug XII: midline tongue extension Motor: Right : Upper extremity   5/5    Left:     Upper extremity   4/5--RTC tear  Lower extremity   5/5     Lower extremity   5/5 Tone and bulk:normal tone throughout; no atrophy noted Sensory: Pinprick and light touch intact throughout, bilaterally Deep Tendon Reflexes: 2+ and symmetric throughout Plantars: Right: downgoing   Left: downgoing Cerebellar: normal finger-to-nose, Gait: Not tested      Lab Results: Basic Metabolic Panel:  Recent Labs Lab 09/20/16 1220 09/20/16 1231  NA 142 142  K 3.5 3.4*  CL 104 100*  CO2 29  --   GLUCOSE 112* 107*  BUN 10 11  CREATININE 0.84 0.70  CALCIUM 8.9  --  Liver Function Tests:  Recent Labs Lab 09/20/16 1220  AST 27  ALT 12*  ALKPHOS 46  BILITOT 1.8*  PROT 6.2*  ALBUMIN 3.9   No results for input(s): LIPASE, AMYLASE in the last 168 hours. No results for input(s): AMMONIA in the last 168 hours.  CBC:  Recent Labs Lab 09/20/16 1220 09/20/16 1231  WBC 7.5  --   NEUTROABS 5.9  --   HGB 14.4 14.3  HCT 43.4 42.0  MCV 93.9  --   PLT 147*  --     Cardiac Enzymes:  Recent Labs Lab 09/20/16 1220  CKTOTAL 509*    Lipid Panel: No results for input(s): CHOL,  TRIG, HDL, CHOLHDL, VLDL, LDLCALC in the last 168 hours.  CBG:  Recent Labs Lab 09/20/16 1206  GLUCAP 110*    Microbiology: No results found for this or any previous visit.  Coagulation Studies:  Recent Labs  09/20/16 1220  LABPROT 17.8*  INR 1.45    Imaging: Dg Chest 1 View  Result Date: 09/20/2016 CLINICAL DATA:  Fall, stroke-like symptoms EXAM: CHEST 1 VIEW COMPARISON:  09/16/2016 FINDINGS: Cardiomediastinal silhouette is stable. Large hiatal hernia again noted. Central vascular congestion and probable chronic interstitial prominence again noted. Mild degenerative changes bilateral shoulders. No definite superimposed infiltrate or pulmonary edema. Mild basilar atelectasis. IMPRESSION: Large hiatal hernia again noted. Central vascular congestion and probable chronic interstitial prominence again noted. Mild degenerative changes bilateral shoulders. No definite superimposed infiltrate or pulmonary edema. Electronically Signed   By: Lahoma Crocker M.D.   On: 09/20/2016 12:46   Dg Pelvis 1-2 Views  Result Date: 09/20/2016 CLINICAL DATA:  Stroke.  Found on the floor. EXAM: PELVIS - 1-2 VIEW COMPARISON:  Pelvis CT dated 11/30/2007. FINDINGS: Normal appearing pelvic bones and hips. No fracture or dislocation seen. Lower lumbar spine degenerative changes and scoliosis. Atheromatous arterial calcifications, including the abdominal aorta. IMPRESSION: No acute abnormality.  Aortic atherosclerosis. Electronically Signed   By: Claudie Revering M.D.   On: 09/20/2016 12:46   Ct Head Wo Contrast  Result Date: 09/20/2016 CLINICAL DATA:  83 year old male with slurred speech. Left-sided weakness. Fall. Dementia. Initial encounter. EXAM: CT HEAD WITHOUT CONTRAST CT CERVICAL SPINE WITHOUT CONTRAST TECHNIQUE: Multidetector CT imaging of the head and cervical spine was performed following the standard protocol without intravenous contrast. Multiplanar CT image reconstructions of the cervical spine were also  generated. COMPARISON:  09/16/2016 head CT. 04/06/2016 brain MR. 07/06/2011 plain film exam of the cervical spine. FINDINGS: CT HEAD FINDINGS Brain: No intracranial hemorrhage or CT evidence of large acute infarct. Remote inferior right cerebellar infarct. Moderate chronic microvascular changes. Moderate global atrophy without hydrocephalus. No intracranial mass lesion noted on this unenhanced exam. Vascular: Vascular calcifications. Skull: No skull fracture. Sinuses/Orbits: Post lens replacement without acute orbital abnormality. Mild mucosal thickening ethmoid sinus air cells bilaterally. Other: Negative. CT CERVICAL SPINE FINDINGS Alignment: Mild curvature.  Minimal anterior slip C2 and C7. Skull base and vertebrae: No cervical spine fracture Soft tissues and spinal canal: No abnormal prevertebral soft tissue swelling. Disc levels: Cervical spondylotic changes most notable C4-5 thru C6-7. Upper chest: Scarring. Other: Carotid bifurcation calcifications. IMPRESSION: CT HEAD No skull fracture or intracranial hemorrhage. No CT evidence of large acute infarct. Remote inferior right cerebellar infarct. Moderate chronic microvascular changes. Moderate global atrophy without hydrocephalus. CT CERVICAL SPINE No cervical spine fracture or abnormal prevertebral soft tissue swelling. Cervical spondylotic changes most notable C4-5 thru C6-7. Electronically Signed   By: Alcide Evener.D.  On: 09/20/2016 13:16   Ct Cervical Spine Wo Contrast  Result Date: 09/20/2016 CLINICAL DATA:  83 year old male with slurred speech. Left-sided weakness. Fall. Dementia. Initial encounter. EXAM: CT HEAD WITHOUT CONTRAST CT CERVICAL SPINE WITHOUT CONTRAST TECHNIQUE: Multidetector CT imaging of the head and cervical spine was performed following the standard protocol without intravenous contrast. Multiplanar CT image reconstructions of the cervical spine were also generated. COMPARISON:  09/16/2016 head CT. 04/06/2016 brain MR. 07/06/2011  plain film exam of the cervical spine. FINDINGS: CT HEAD FINDINGS Brain: No intracranial hemorrhage or CT evidence of large acute infarct. Remote inferior right cerebellar infarct. Moderate chronic microvascular changes. Moderate global atrophy without hydrocephalus. No intracranial mass lesion noted on this unenhanced exam. Vascular: Vascular calcifications. Skull: No skull fracture. Sinuses/Orbits: Post lens replacement without acute orbital abnormality. Mild mucosal thickening ethmoid sinus air cells bilaterally. Other: Negative. CT CERVICAL SPINE FINDINGS Alignment: Mild curvature.  Minimal anterior slip C2 and C7. Skull base and vertebrae: No cervical spine fracture Soft tissues and spinal canal: No abnormal prevertebral soft tissue swelling. Disc levels: Cervical spondylotic changes most notable C4-5 thru C6-7. Upper chest: Scarring. Other: Carotid bifurcation calcifications. IMPRESSION: CT HEAD No skull fracture or intracranial hemorrhage. No CT evidence of large acute infarct. Remote inferior right cerebellar infarct. Moderate chronic microvascular changes. Moderate global atrophy without hydrocephalus. CT CERVICAL SPINE No cervical spine fracture or abnormal prevertebral soft tissue swelling. Cervical spondylotic changes most notable C4-5 thru C6-7. Electronically Signed   By: Genia Del M.D.   On: 09/20/2016 13:16       Assessment and plan per attending neurologist  Etta Quill PA-C [facility] [phone]  09/20/2016, 3:25 PM   Assessment/Plan:  This is an 83 year old male with history of cerebellar stroke, dementia, left rotator cuff tear and significant stress over the last few weeks with his wife being and palliative care. Patient found down this morning. Given history and physical findings most likely suffered from a seizure. Is also is backed up by the elevated CK.  Recommend: MRI of brain without contrast Initiate oral bolus of Dilantin followed by 300 mg daily at  bedtime Dilantin the following night. EEG

## 2016-09-20 NOTE — Progress Notes (Signed)
Patient alert with confusion.  Family states this his baseline.  Patient can follow simple commands.  Patient is noted to have difficulty swallowing medications, pocketing pills, trying to chew instead of swallowing pills.  Thin liquids offered, patient begins to cough at times.  Lung sound clear bilaterally. No noted edema in extremities.

## 2016-09-20 NOTE — H&P (Signed)
History and Physical    John Reeves ZTI:[id number] DOB: Jul 23, 1928 DOA: 09/20/2016  PCP: Nicoletta Dress, MD   Patient coming from: home  Chief Complaint: HPI: John Reeves is a 83 y.o. male with medical history significant of Afib - not on anticoagulation therapy d/t risk of falls, diabetes, hyperlipidemia, severe dementia, pervious history of cerebellar stroke, left rotator cough tear who presented to th ED today via EMS after patient was found unresponsive on th floor in his house. Patient was last seen normal around 7-8 pm last night and today found on the floor by his daughter around 79 am. Patient lived with his wife who since the middle of February was in palliative care and died last night. His daughter stepped in to check on him this morning and found him lying on the flor among the scattered staff and incontinent. She called her brother who lives next door to come for help. The son had difficulty waking him up, patient was very groggy and speech  Incoherent, he also was having significant weakness and difficulty ambulating  The family reported that since Friday patient was getting progressively confused and halicionating. He had visual hallucinations about someone breaking int his house,  managed to get his gun that was not loaded and started pointing it to the imaginary person. Later in the eveninghe was convinced that an intruder occupied his recliner and lifted the regular chair and started hitting recliner with it saying that he would kill that guy for coming in.  ED Course: on presentation VS were stable except except mild tachypnea Blood work was notable for mild hypokalemia,  Urine was not suggestive of UTI,   Chest x-ray did not show definite superimposed infiltrate or pulmonary edema Pelvis didn't show any acute abnormality Total CK was 509, troponin 0.04 with top limit 0.08 UDS was negative for benzos or illicit drugs EKG showed A. fib with age-indeterminate and QS in the  inferior leads consistent with inferior MI  Review of Systems: As per HPI otherwise 10 point review of systems negative.   Ambulatory Status: Independent  Past Medical History:  Diagnosis Date  . Dementia   . Stroke [facility])     History reviewed. No pertinent surgical history.  Social History   Social History  . Marital status: Married    Spouse name: N/A  . Number of children: N/A  . Years of education: N/A   Occupational History  . Not on file.   Social History Main Topics  . Smoking status: Former Research scientist (life sciences)  . Smokeless tobacco: Never Used  . Alcohol use No  . Drug use: Unknown  . Sexual activity: Not on file   Other Topics Concern  . Not on file   Social History Narrative  . No narrative on file    No Known Allergies  No family history on file.  Prior to Admission medications   Medication Sig Start Date End Date Taking? Authorizing Provider  albuterol (PROVENTIL HFA;VENTOLIN HFA) 108 (90 Base) MCG/ACT inhaler Inhale 2 puffs into the lungs every 6 (six) hours as needed for wheezing or shortness of breath.    Yes Historical Provider, MD  Cholecalciferol (VITAMIN D PO) Take 1 capsule by mouth daily.   Yes Historical Provider, MD  clonazePAM (KLONOPIN) 0.5 MG tablet Take 0.5 mg by mouth 2 (two) times daily as needed for anxiety.  09/17/16  Yes Historical Provider, MD  Cyanocobalamin (VITAMIN B-12 PO) Take 1 tablet by mouth daily.   Yes Historical Provider,  MD  famotidine (PEPCID) 20 MG tablet Take 20 mg by mouth 2 (two) times daily.   Yes Historical Provider, MD  ferrous sulfate 325 (65 FE) MG tablet Take 325 mg by mouth daily.   Yes Historical Provider, MD  furosemide (LASIX) 20 MG tablet Take 20 mg by mouth daily.   Yes Historical Provider, MD  levothyroxine (SYNTHROID, LEVOTHROID) 125 MCG tablet Take 125 mcg by mouth daily before breakfast.   Yes Historical Provider, MD  metFORMIN (GLUCOPHAGE) 500 MG tablet Take 500 mg by mouth daily.   Yes Historical Provider, MD    NON FORMULARY 1 each by Other route See admin instructions. CPAP nightly   Yes Historical Provider, MD  simvastatin (ZOCOR) 20 MG tablet Take 20 mg by mouth every evening.   Yes Historical Provider, MD  tamsulosin (FLOMAX) 0.4 MG CAPS capsule Take 0.4 mg by mouth every evening.   Yes Historical Provider, MD    Physical Exam: Vitals:   09/20/16 1430 09/20/16 1515 09/20/16 1635 09/20/16 1730  BP: (!) 154/98 (!) 155/87 (!) 155/73 (!) 138/92  Pulse: 79 73 80   Resp: (!) 23 (!) 24 (!) 25 (!) 26  Temp:      TempSrc:      SpO2: 96% 96%       General: Appears calm and comfortable Eyes: PERRLA, EOMI, normal lids, iris ENT:  grossly normal hearing, lips & tongue, mucous membranes moist and intact Neck: no lymphoadenopathy, masses or thyromegaly Cardiovascular: irregular S1-S2, no m/r/g. No JVD, carotid bruits. No LE edema.  Respiratory: bilateral no wheezes, rales, rhonchi or cracles. Normal respiratory effort. No accessory muscle use observed Abdomen: soft, non-tender, non-distended, no organomegaly or masses appreciated. BS present in all quadrants Skin: no rash, ulcers or induration seen on limited exam,. Multiple skin cuts and bruises scattered over legs and arms Musculoskeletal: grossly normal tone BUE/BLE, good ROM, no bony abnormality or joint deformities observed Psychiatric: grossly normal mood and affect, speech fluent and appropriate, alert and oriented x3 Neurologic: CN II-XII grossly intact, moves all extremities in coordinated fashion, sensation intact  Labs on Admission: I have personally reviewed following labs and imaging studies  CBC, BMP  GFR: CrCl cannot be calculated (Unknown ideal weight.).   Creatinine Clearance: CrCl cannot be calculated (Unknown ideal weight.).   Radiological Exams on Admission: Dg Chest 1 View  Result Date: 09/20/2016 CLINICAL DATA:  Fall, stroke-like symptoms EXAM: CHEST 1 VIEW COMPARISON:  09/16/2016 FINDINGS: Cardiomediastinal  silhouette is stable. Large hiatal hernia again noted. Central vascular congestion and probable chronic interstitial prominence again noted. Mild degenerative changes bilateral shoulders. No definite superimposed infiltrate or pulmonary edema. Mild basilar atelectasis. IMPRESSION: Large hiatal hernia again noted. Central vascular congestion and probable chronic interstitial prominence again noted. Mild degenerative changes bilateral shoulders. No definite superimposed infiltrate or pulmonary edema. Electronically Signed   By: Lahoma Crocker M.D.   On: 09/20/2016 12:46   Dg Pelvis 1-2 Views  Result Date: 09/20/2016 CLINICAL DATA:  Stroke.  Found on the floor. EXAM: PELVIS - 1-2 VIEW COMPARISON:  Pelvis CT dated 11/30/2007. FINDINGS: Normal appearing pelvic bones and hips. No fracture or dislocation seen. Lower lumbar spine degenerative changes and scoliosis. Atheromatous arterial calcifications, including the abdominal aorta. IMPRESSION: No acute abnormality.  Aortic atherosclerosis. Electronically Signed   By: Claudie Revering M.D.   On: 09/20/2016 12:46   Ct Head Wo Contrast  Result Date: 09/20/2016 CLINICAL DATA:  83 year old male with slurred speech. Left-sided weakness. Fall. Dementia. Initial encounter. EXAM:  CT HEAD WITHOUT CONTRAST CT CERVICAL SPINE WITHOUT CONTRAST TECHNIQUE: Multidetector CT imaging of the head and cervical spine was performed following the standard protocol without intravenous contrast. Multiplanar CT image reconstructions of the cervical spine were also generated. COMPARISON:  09/16/2016 head CT. 04/06/2016 brain MR. 07/06/2011 plain film exam of the cervical spine. FINDINGS: CT HEAD FINDINGS Brain: No intracranial hemorrhage or CT evidence of large acute infarct. Remote inferior right cerebellar infarct. Moderate chronic microvascular changes. Moderate global atrophy without hydrocephalus. No intracranial mass lesion noted on this unenhanced exam. Vascular: Vascular calcifications. Skull:  No skull fracture. Sinuses/Orbits: Post lens replacement without acute orbital abnormality. Mild mucosal thickening ethmoid sinus air cells bilaterally. Other: Negative. CT CERVICAL SPINE FINDINGS Alignment: Mild curvature.  Minimal anterior slip C2 and C7. Skull base and vertebrae: No cervical spine fracture Soft tissues and spinal canal: No abnormal prevertebral soft tissue swelling. Disc levels: Cervical spondylotic changes most notable C4-5 thru C6-7. Upper chest: Scarring. Other: Carotid bifurcation calcifications. IMPRESSION: CT HEAD No skull fracture or intracranial hemorrhage. No CT evidence of large acute infarct. Remote inferior right cerebellar infarct. Moderate chronic microvascular changes. Moderate global atrophy without hydrocephalus. CT CERVICAL SPINE No cervical spine fracture or abnormal prevertebral soft tissue swelling. Cervical spondylotic changes most notable C4-5 thru C6-7. Electronically Signed   By: Genia Del M.D.   On: 09/20/2016 13:16   Ct Cervical Spine Wo Contrast  Result Date: 09/20/2016 CLINICAL DATA:  83 year old male with slurred speech. Left-sided weakness. Fall. Dementia. Initial encounter. EXAM: CT HEAD WITHOUT CONTRAST CT CERVICAL SPINE WITHOUT CONTRAST TECHNIQUE: Multidetector CT imaging of the head and cervical spine was performed following the standard protocol without intravenous contrast. Multiplanar CT image reconstructions of the cervical spine were also generated. COMPARISON:  09/16/2016 head CT. 04/06/2016 brain MR. 07/06/2011 plain film exam of the cervical spine. FINDINGS: CT HEAD FINDINGS Brain: No intracranial hemorrhage or CT evidence of large acute infarct. Remote inferior right cerebellar infarct. Moderate chronic microvascular changes. Moderate global atrophy without hydrocephalus. No intracranial mass lesion noted on this unenhanced exam. Vascular: Vascular calcifications. Skull: No skull fracture. Sinuses/Orbits: Post lens replacement without acute  orbital abnormality. Mild mucosal thickening ethmoid sinus air cells bilaterally. Other: Negative. CT CERVICAL SPINE FINDINGS Alignment: Mild curvature.  Minimal anterior slip C2 and C7. Skull base and vertebrae: No cervical spine fracture Soft tissues and spinal canal: No abnormal prevertebral soft tissue swelling. Disc levels: Cervical spondylotic changes most notable C4-5 thru C6-7. Upper chest: Scarring. Other: Carotid bifurcation calcifications. IMPRESSION: CT HEAD No skull fracture or intracranial hemorrhage. No CT evidence of large acute infarct. Remote inferior right cerebellar infarct. Moderate chronic microvascular changes. Moderate global atrophy without hydrocephalus. CT CERVICAL SPINE No cervical spine fracture or abnormal prevertebral soft tissue swelling. Cervical spondylotic changes most notable C4-5 thru C6-7. Electronically Signed   By: Genia Del M.D.   On: 09/20/2016 13:16    EKG: Independently reviewed - Afib with controlled HR, QS in II-III   Assessment/Plan Principal Problem:   Rhabdomyolysis Active Problems:   Dementia   Fall due to seizure [facility])   Atrial fibrillation, chronic ([place])   Diabetes mellitus ([place])   Rhabdomyolysis, most likely early stage -associated with  with fall Continue IV hydration and follow CPK level  Probable seizure with fall Patient was seen by neurology service in consultation and placed on Dilantin - bolus and bedtime dose Continue to monitor symptoms and check Dilantin level Brain MRI and EEG are pending Patient might not be able to  tolerate MRI d/t agitation and family is ware about this possibility  Severe dementia - patient has been under significant stress lately that could explain his visual hallucinations Provide supportive care, monitor for safety Patient will need post discharge placement if not returning home - social worker/case management consult requested for placement Palliative care consult requested with potential to  advance to home hospice Will discontinue neuro checks, telemetry in attempt to reduce external stimulation in a patient with advanced dementia and visual hallucinations Will order Haldol and Ativan IV prn for confusion and agitation  Afib - chronic, Patient has a history of stroke, was seen by neuro in the past and it was decided not to start any anticoagulation therapy d/t high risk of falls  DM type II Continue Glucophage as we do not anticipate any procedure while inpatient  DVT prophylaxis: heparin Code Status: DNR Family Communication: at bedside Disposition Plan: MEdsurg Consults called: Neuro by EDP Admission status: inpatient   York Grice, [place] Pager: [phone] [facility]  If 7PM-7AM, please contact night-coverage [email or web address] Password [email or web address]  09/20/2016, 5:52 PM

## 2016-09-20 NOTE — ED Notes (Signed)
Admitting MD at bedside.

## 2016-09-20 NOTE — Progress Notes (Signed)
Reviewing chart, swallow evaluation completed by nurse per verbal report, undocumented.  Contact Cindy at 1910 to verify evaluation was complete.  Nurse stated she would document.

## 2016-09-20 NOTE — Progress Notes (Signed)
Patient arrived to unit 1800 accompanied by family.  Telemetry applied.  Patient alert with confusion.  No noted discomfort.

## 2016-09-20 NOTE — Progress Notes (Signed)
I have paged Rondel Baton with [facility] due to patient being npo and changing dilantin from PO to IV meds.  No response

## 2016-09-20 NOTE — ED Provider Notes (Signed)
[facility] DEPT Provider Note   CSN: [id number] Arrival date & time: 09/20/16  1156     History   Chief Complaint Chief Complaint  Patient presents with  . Weakness    HPI John Reeves is a 83 y.o. male hx of dementia, previous stroke in 2017, Here presenting with mental status, hallucinations, slurred speech. Patient lives at home by himself. His wife is in hospice and is dying currently. His some comes and visits him daily. His son visited him last night around 9 PM and he was at his baseline. Son came over to visit him around 9 AM this morning and he appeared to have slurred speech and son was unable to understand him. He was also laying on the floor and unable to get up. Patient also had worsening left-sided weakness. Of note, last week, patient has been having more visual hallucination and actually carrying a gun to his son's workplace saying that somebody is invading his home. He also has more visual hallucinations and seeing people that are not there. He was seen in [facility] last Friday and was sent home but symptoms got worse.    The history is provided by the patient and a relative.    Past Medical History:  Diagnosis Date  . Dementia   . Stroke [facility])     There are no active problems to display for this patient.   History reviewed. No pertinent surgical history.     Home Medications    Prior to Admission medications   Not on File    Family History No family history on file.  Social History Social History  Substance Use Topics  . Smoking status: Former Research scientist (life sciences)  . Smokeless tobacco: Never Used  . Alcohol use No     Allergies   Patient has no known allergies.   Review of Systems Review of Systems  Neurological: Positive for speech difficulty and weakness.  All other systems reviewed and are negative.    Physical Exam Updated Vital Signs BP (!) 157/86   Pulse 67   Temp 98.3 F (36.8 C) (Rectal)   Resp (!) 22   SpO2 93%    Physical Exam  Constitutional:  Chronically ill, demented   HENT:  Head: Normocephalic.  Mouth/Throat: Oropharynx is clear and moist.  Eyes: EOM are normal. Pupils are equal, round, and reactive to light.  Neck: Normal range of motion. Neck supple.  Cardiovascular: Normal rate, regular rhythm and normal heart sounds.   Pulmonary/Chest: Effort normal and breath sounds normal. No respiratory distress. He has no wheezes.  Abdominal: Soft. Bowel sounds are normal. He exhibits no distension. There is no tenderness. There is no guarding.  Musculoskeletal: Normal range of motion.  No spinal tenderness or deformity, pelvis stable   Neurological:  Alert, confused. + slurred speech, no obvious facial droop. + mild L pronator drift, strength 4/5 L arm, 5/5 throughout otherwise.   Skin: Skin is warm.  Psychiatric:  Unable   Nursing note and vitals reviewed.    ED Treatments / Results  Labs (all labs ordered are listed, but only abnormal results are displayed) Labs Reviewed  PROTIME-INR - Abnormal; Notable for the following:       Result Value   Prothrombin Time 17.8 (*)    All other components within normal limits  CBC - Abnormal; Notable for the following:    Platelets 147 (*)    All other components within normal limits  COMPREHENSIVE METABOLIC PANEL - Abnormal; Notable for  the following:    Glucose, Bld 112 (*)    Total Protein 6.2 (*)    ALT 12 (*)    Total Bilirubin 1.8 (*)    All other components within normal limits  URINALYSIS, ROUTINE W REFLEX MICROSCOPIC - Abnormal; Notable for the following:    Color, Urine AMBER (*)    Hgb urine dipstick MODERATE (*)    Ketones, ur 5 (*)    Protein, ur 30 (*)    All other components within normal limits  CK - Abnormal; Notable for the following:    Total CK 509 (*)    All other components within normal limits  CBG MONITORING, ED - Abnormal; Notable for the following:    Glucose-Capillary 110 (*)    All other components within  normal limits  I-STAT CHEM 8, ED - Abnormal; Notable for the following:    Potassium 3.4 (*)    Chloride 100 (*)    Glucose, Bld 107 (*)    Calcium, Ion 1.07 (*)    All other components within normal limits  ETHANOL  APTT  DIFFERENTIAL  RAPID URINE DRUG SCREEN, HOSP PERFORMED  I-STAT TROPOININ, ED  I-STAT CG4 LACTIC ACID, ED    EKG  EKG Interpretation  Date/Time:  Tuesday September 20 2016 12:07:20 EDT Ventricular Rate:  78 PR Interval:    QRS Duration: 148 QT Interval:  422 QTC Calculation: 481 R Axis:   -103 Text Interpretation:  Atrial fibrillation RBBB and LAFB Inferior infarct, old No significant change since last tracing Confirmed by Tiauna Whisnant  MD, Amilliana Hayworth ([id number]) on 09/20/2016 12:09:37 PM Also confirmed by Darl Householder  MD, Adelynne Joerger ([id number]), editor Lorenda Cahill CT, [place] [id number])  on 09/20/2016 12:24:07 PM       Radiology Dg Chest 1 View  Result Date: 09/20/2016 CLINICAL DATA:  Fall, stroke-like symptoms EXAM: CHEST 1 VIEW COMPARISON:  09/16/2016 FINDINGS: Cardiomediastinal silhouette is stable. Large hiatal hernia again noted. Central vascular congestion and probable chronic interstitial prominence again noted. Mild degenerative changes bilateral shoulders. No definite superimposed infiltrate or pulmonary edema. Mild basilar atelectasis. IMPRESSION: Large hiatal hernia again noted. Central vascular congestion and probable chronic interstitial prominence again noted. Mild degenerative changes bilateral shoulders. No definite superimposed infiltrate or pulmonary edema. Electronically Signed   By: Lahoma Crocker M.D.   On: 09/20/2016 12:46   Dg Pelvis 1-2 Views  Result Date: 09/20/2016 CLINICAL DATA:  Stroke.  Found on the floor. EXAM: PELVIS - 1-2 VIEW COMPARISON:  Pelvis CT dated 11/30/2007. FINDINGS: Normal appearing pelvic bones and hips. No fracture or dislocation seen. Lower lumbar spine degenerative changes and scoliosis. Atheromatous arterial calcifications, including the abdominal aorta. IMPRESSION: No  acute abnormality.  Aortic atherosclerosis. Electronically Signed   By: Claudie Revering M.D.   On: 09/20/2016 12:46   Ct Head Wo Contrast  Result Date: 09/20/2016 CLINICAL DATA:  83 year old male with slurred speech. Left-sided weakness. Fall. Dementia. Initial encounter. EXAM: CT HEAD WITHOUT CONTRAST CT CERVICAL SPINE WITHOUT CONTRAST TECHNIQUE: Multidetector CT imaging of the head and cervical spine was performed following the standard protocol without intravenous contrast. Multiplanar CT image reconstructions of the cervical spine were also generated. COMPARISON:  09/16/2016 head CT. 04/06/2016 brain MR. 07/06/2011 plain film exam of the cervical spine. FINDINGS: CT HEAD FINDINGS Brain: No intracranial hemorrhage or CT evidence of large acute infarct. Remote inferior right cerebellar infarct. Moderate chronic microvascular changes. Moderate global atrophy without hydrocephalus. No intracranial mass lesion noted on this unenhanced exam. Vascular: Vascular calcifications. Skull: No skull  fracture. Sinuses/Orbits: Post lens replacement without acute orbital abnormality. Mild mucosal thickening ethmoid sinus air cells bilaterally. Other: Negative. CT CERVICAL SPINE FINDINGS Alignment: Mild curvature.  Minimal anterior slip C2 and C7. Skull base and vertebrae: No cervical spine fracture Soft tissues and spinal canal: No abnormal prevertebral soft tissue swelling. Disc levels: Cervical spondylotic changes most notable C4-5 thru C6-7. Upper chest: Scarring. Other: Carotid bifurcation calcifications. IMPRESSION: CT HEAD No skull fracture or intracranial hemorrhage. No CT evidence of large acute infarct. Remote inferior right cerebellar infarct. Moderate chronic microvascular changes. Moderate global atrophy without hydrocephalus. CT CERVICAL SPINE No cervical spine fracture or abnormal prevertebral soft tissue swelling. Cervical spondylotic changes most notable C4-5 thru C6-7. Electronically Signed   By: Genia Del  M.D.   On: 09/20/2016 13:16   Ct Cervical Spine Wo Contrast  Result Date: 09/20/2016 CLINICAL DATA:  83 year old male with slurred speech. Left-sided weakness. Fall. Dementia. Initial encounter. EXAM: CT HEAD WITHOUT CONTRAST CT CERVICAL SPINE WITHOUT CONTRAST TECHNIQUE: Multidetector CT imaging of the head and cervical spine was performed following the standard protocol without intravenous contrast. Multiplanar CT image reconstructions of the cervical spine were also generated. COMPARISON:  09/16/2016 head CT. 04/06/2016 brain MR. 07/06/2011 plain film exam of the cervical spine. FINDINGS: CT HEAD FINDINGS Brain: No intracranial hemorrhage or CT evidence of large acute infarct. Remote inferior right cerebellar infarct. Moderate chronic microvascular changes. Moderate global atrophy without hydrocephalus. No intracranial mass lesion noted on this unenhanced exam. Vascular: Vascular calcifications. Skull: No skull fracture. Sinuses/Orbits: Post lens replacement without acute orbital abnormality. Mild mucosal thickening ethmoid sinus air cells bilaterally. Other: Negative. CT CERVICAL SPINE FINDINGS Alignment: Mild curvature.  Minimal anterior slip C2 and C7. Skull base and vertebrae: No cervical spine fracture Soft tissues and spinal canal: No abnormal prevertebral soft tissue swelling. Disc levels: Cervical spondylotic changes most notable C4-5 thru C6-7. Upper chest: Scarring. Other: Carotid bifurcation calcifications. IMPRESSION: CT HEAD No skull fracture or intracranial hemorrhage. No CT evidence of large acute infarct. Remote inferior right cerebellar infarct. Moderate chronic microvascular changes. Moderate global atrophy without hydrocephalus. CT CERVICAL SPINE No cervical spine fracture or abnormal prevertebral soft tissue swelling. Cervical spondylotic changes most notable C4-5 thru C6-7. Electronically Signed   By: Genia Del M.D.   On: 09/20/2016 13:16    Procedures Procedures (including critical  care time)  Medications Ordered in ED Medications - No data to display   Initial Impression / Assessment and Plan / ED Course  I have reviewed the triage vital signs and the nursing notes.  Pertinent labs & imaging results that were available during my care of the patient were reviewed by me and considered in my medical decision making (see chart for details).     John Reeves is a 83 y.o. male here with slurred speech, l arm weakness, visual hallucinations. Patient has hx of dementia and has been on the floor since last night and unable to get up. Speech still not back to baseline. Patient unable to care for himself at home. Consider stroke vs metabolic abnormalities vs UTI vs pneumonia vs rhabdo. Will get labs, CT head, CK. Will hydrate and consult neuro.   2:08 PM CT head/neck unremarkable. cxr, pelvic xray unremarkable. Patient slightly agitated and still hallucinating.  CK 500. Will hydrate patient. Neuro consulted for TIA/ stroke/ syncope workup.    Final Clinical Impressions(s) / ED Diagnoses   Final diagnoses:  None    New Prescriptions New Prescriptions   No medications  on file     Drenda Freeze, MD 09/20/16 [phone]

## 2016-09-21 ENCOUNTER — Inpatient Hospital Stay (HOSPITAL_COMMUNITY)
Admit: 2016-09-21 | Discharge: 2016-09-21 | Disposition: A | Discharge status: Home / Self Care | Payer: Medicare Other | Attending: Neurology | Admitting: Neurology

## 2016-09-21 DIAGNOSIS — R569 Unspecified convulsions: Principal | ICD-10-CM

## 2016-09-21 DIAGNOSIS — Z66 Do not resuscitate: Secondary | ICD-10-CM

## 2016-09-21 DIAGNOSIS — R4182 Altered mental status, unspecified: Secondary | ICD-10-CM

## 2016-09-21 DIAGNOSIS — F0281 Dementia in other diseases classified elsewhere with behavioral disturbance: Secondary | ICD-10-CM

## 2016-09-21 DIAGNOSIS — G9340 Encephalopathy, unspecified: Secondary | ICD-10-CM

## 2016-09-21 DIAGNOSIS — Z515 Encounter for palliative care: Secondary | ICD-10-CM

## 2016-09-21 LAB — GLUCOSE, CAPILLARY
GLUCOSE-CAPILLARY: 105 mg/dL — AB (ref 65–99)
GLUCOSE-CAPILLARY: 71 mg/dL (ref 65–99)
GLUCOSE-CAPILLARY: 76 mg/dL (ref 65–99)
GLUCOSE-CAPILLARY: 79 mg/dL (ref 65–99)
Glucose-Capillary: 94 mg/dL (ref 65–99)
Glucose-Capillary: 91 mg/dL (ref 65–99)

## 2016-09-21 LAB — PHENYTOIN LEVEL, TOTAL: PHENYTOIN LVL: 16.8 ug/mL (ref 10.0–20.0)

## 2016-09-21 LAB — BASIC METABOLIC PANEL
Anion gap: 9 (ref 5–15)
BUN: 11 mg/dL (ref 6–20)
CO2: 26 mmol/L (ref 22–32)
Calcium: 8.4 mg/dL — ABNORMAL LOW (ref 8.9–10.3)
Chloride: 106 mmol/L (ref 101–111)
Creatinine, Ser: 0.74 mg/dL (ref 0.61–1.24)
GFR calc Af Amer: >60 mL/min (ref >60)
GFR calc non Af Amer: >60 mL/min (ref >60)
GLUCOSE: 83 mg/dL (ref 65–99)
POTASSIUM: 3.6 mmol/L (ref 3.5–5.1)
Sodium: 141 mmol/L (ref 135–145)

## 2016-09-21 LAB — CK: Total CK: 657 U/L — ABNORMAL HIGH (ref 49–397)

## 2016-09-21 LAB — MAGNESIUM: Magnesium: 2 mg/dL (ref 1.7–2.4)

## 2016-09-21 LAB — ALBUMIN: ALBUMIN: 3.5 g/dL (ref 3.5–5.0)

## 2016-09-21 MED ORDER — FAMOTIDINE IN NACL 20-0.9 MG/50ML-% IV SOLN
20.0000 mg | Freq: Two times a day (BID) | INTRAVENOUS | Status: DC
  Administered 2016-09-21 – 2016-09-22 (×2): 20 mg via INTRAVENOUS
  Filled 2016-09-21 (×2): qty 50

## 2016-09-21 MED ORDER — POTASSIUM CHLORIDE IN NACL 20-0.9 MEQ/L-% IV SOLN
INTRAVENOUS | Status: DC
  Administered 2016-09-21 – 2016-09-22 (×3): via INTRAVENOUS
  Filled 2016-09-21 (×4): qty 1000

## 2016-09-21 MED ORDER — SODIUM CHLORIDE 0.9 % IV SOLN
1000.0000 mg | INTRAVENOUS | Status: AC
  Administered 2016-09-21: 1000 mg via INTRAVENOUS
  Filled 2016-09-21: qty 20

## 2016-09-21 MED ORDER — LEVOTHYROXINE SODIUM 100 MCG IV SOLR
62.5000 ug | Freq: Every day | INTRAVENOUS | Status: DC
  Administered 2016-09-21: 62.5 ug via INTRAVENOUS
  Filled 2016-09-21 (×2): qty 5

## 2016-09-21 MED ORDER — METOPROLOL TARTRATE 5 MG/5ML IV SOLN: 5.0000 mg | Freq: Four times a day (QID) | INTRAVENOUS | Status: DC | PRN

## 2016-09-21 MED ORDER — INSULIN ASPART 100 UNIT/ML ~~LOC~~ SOLN: 0.0000 [IU] | Freq: Three times a day (TID) | SUBCUTANEOUS | Status: DC

## 2016-09-21 NOTE — Progress Notes (Signed)
Neurology Progress Note  Subjective: He had some confusion overnight. RN noted that he was having difficulty swallowing medications with a tendency to pocket and chew pills rather than simply swallowing them. He was also noted to cough with thin liquids. SLP consulted for swallow eval which was done this morning. They recommended he be NPO due to overt evidence of aspiration with any consistency. The patient is presently sleeping. He will briefly rouse to voice but does not participate with ROS or with the exam.   Due to his inability to take PO meds, he has not yet received any Dilantin. IV load of fosphenytoin has been ordered for this morning by PA.   Medications reviewed and reconciled.   Pertinent meds: Fosphenytoin load 1000 PE   Current Meds:   Current Facility-Administered Medications:  .  0.9 % NaCl with KCl 20 mEq/ L  infusion, , Intravenous, Continuous, Eugenie Filler, MD, Last Rate: 125 mL/hr at 09/21/16 0940 .  acetaminophen (TYLENOL) tablet 650 mg, 650 mg, Oral, Q6H PRN **OR** acetaminophen (TYLENOL) suppository 650 mg, 650 mg, Rectal, Q6H PRN, Brenton Grills, PA-C .  albuterol (PROVENTIL) (2.5 MG/3ML) 0.083% nebulizer solution 2.5 mg, 2.5 mg, Nebulization, Q6H PRN, Brenton Grills, PA-C .  enoxaparin (LOVENOX) injection 30 mg, 30 mg, Subcutaneous, Q24H, Marina S [place], PA-C, 30 mg at 09/20/16 1836 .  famotidine (PEPCID) tablet 20 mg, 20 mg, Oral, BID, Marina S Kyazimova, PA-C .  ferrous sulfate tablet 325 mg, 325 mg, Oral, Daily, Brenton Grills, PA-C, 325 mg at 09/20/16 1836 .  fosPHENYtoin (CEREBYX) 1,000 mg PE in sodium chloride 0.9 % 50 mL IVPB, 1,000 mg PE, Intravenous, STAT, Marliss Coots, PA-C .  furosemide (LASIX) tablet 20 mg, 20 mg, Oral, Daily, Marina S Kyazimova, PA-C, 20 mg at 09/20/16 1836 .  haloperidol lactate (HALDOL) injection 2 mg, 2 mg, Intravenous, Q6H PRN, Brenton Grills, PA-C .  insulin aspart (novoLOG) injection 0-9 Units, 0-9 Units,  Subcutaneous, TID WC, Eugenie Filler, MD .  levothyroxine (SYNTHROID, LEVOTHROID) tablet 125 mcg, 125 mcg, Oral, QAC breakfast, Brenton Grills, PA-C .  LORazepam (ATIVAN) injection 1 mg, 1 mg, Intravenous, Q6H PRN, Brenton Grills, PA-C, 1 mg at 09/20/16 1944 .  ondansetron (ZOFRAN) tablet 4 mg, 4 mg, Oral, Q6H PRN **OR** ondansetron (ZOFRAN) injection 4 mg, 4 mg, Intravenous, Q6H PRN, Brenton Grills, PA-C .  phenytoin (DILANTIN) ER capsule 300 mg, 300 mg, Oral, QHS, Darrel Reach, MD .  polyethylene glycol (MIRALAX / GLYCOLAX) packet 17 g, 17 g, Oral, Daily PRN, Brenton Grills, PA-C .  simvastatin (ZOCOR) tablet 20 mg, 20 mg, Oral, QPM, Brenton Grills, PA-C, 20 mg at 09/20/16 1836 .  tamsulosin (FLOMAX) capsule 0.4 mg, 0.4 mg, Oral, QPM, Brenton Grills, PA-C, 0.4 mg at 09/20/16 1835  Objective:  Temp:  [98.1 F (36.7 C)-98.3 F (36.8 C)] 98.1 F (36.7 C) (04/04 0545) Pulse Rate:  [26-147] 55 (04/04 0545) Resp:  [20-26] 20 (04/04 0545) BP: (130-157)/(73-107) 143/88 (04/04 0545) SpO2:  [93 %-97 %] 97 % (04/04 0545)  General: WDWN elderly Caucasian man sleeping soundly. He is in NAD. He will briefly open his eyes to voice and tactile stimulation but quickly goes right back to sleep. He does not follow any commands.   HEENT: Neck is supple without lymphadenopathy. Mucous membranes appear moist. Sclerae are anicteric. There is no conjunctival injection.  CV: Regular. 2/6 apical late systolic murmur without radiation. Carotid pulses are  2+ and symmetric with no bruits. Distal pulses 2+ and symmetric.  Lungs: CTAB on anterior exam. Extremities: No C/C/E. Neuro: MS: As noted above.  CN: Pupils are equal and reactive from 2-->1 mm bilaterally. He does not clearly blink to visual threat. Eyes are conjugate. No forced deviation or nystagmus. Corneals are intact and symmetric. His face is symmetric at rest. He has a symmetric grimace. The remainder of his cranial nerves  cannot be accurately assessed as he does not participate with the exam. Motor: Normal bulk, tone. He does not participate with confrontational strength testing. No tremor or other abnormal movements are observed.  Sensation: He stirs and withdraws from minimal noxious stimuli x4.   DTRs: 2+, symmetric. Toes are downgoing bilaterally.  Coordination/gait: These cannot be assessed as he is unable to participate with the exam.   Labs: Lab Results  Component Value Date   WBC 7.5 09/20/2016   HGB 14.3 09/20/2016   HCT 42.0 09/20/2016   PLT 147 (L) 09/20/2016   GLUCOSE 107 (H) 09/20/2016   ALT 12 (L) 09/20/2016   AST 27 09/20/2016   NA 142 09/20/2016   K 3.4 (L) 09/20/2016   CL 100 (L) 09/20/2016   CREATININE 0.70 09/20/2016   BUN 11 09/20/2016   CO2 29 09/20/2016   INR 1.45 09/20/2016   CBC Latest Ref Rng & Units 09/20/2016 09/20/2016  WBC 4.0 - 10.5 K/uL - 7.5  Hemoglobin 13.0 - 17.0 g/dL 14.3 14.4  Hematocrit 39.0 - 52.0 % 42.0 43.4  Platelets 150 - 400 K/uL - 147(L)    No results found for: HGBA1C Lab Results  Component Value Date   ALT 12 (L) 09/20/2016   AST 27 09/20/2016   ALKPHOS 46 09/20/2016   BILITOT 1.8 (H) 09/20/2016    Radiology:  I have personally and independently reviewed the MRI brain without contrast from 09/20/16. This is a limited study as the patient could not tolerate the scan. Only DWI and FLAIR sequences were obtained and these are degraded by motion. There is no evidence of acute ischemic on his DWI. He has mild chronic small vessel ischemic change in the bihemispheric white matter.   Other diagnostic studies:  EEG pending   A/P:   1. Possible seizure: History as reported is concerning for possible seizure. His underlying dementia places him at risk for seizure. He was unable to get his oral phenytoin load overnight so IV load has been ordered now. Will check level about three hours after the load. Will initiate maintenance based upon level--likely 100 mg  q 8hrs IV versus 300 mg PO qhs if his swallow improves. Seizure precautions.   2. Acute encephalopathy: His initial confusion has improved. Family reported to RN last night that he appeared to be at his baseline. Follow. Optimize metabolic status, limit CNS active meds, especially benzos, opiates, and anything with strong anticholinergic properties.   3. Dementia: he has very advanced dementia at baseline. This predisposes him to delirium and encephalopathy from all causes, even seemingly trivial insults or changes. Treatment as this stage is supportive. Family is pursuing placement.   No family present at the time of my visit. Will continue to follow with you.    Melba Coon, MD [facility]

## 2016-09-21 NOTE — Evaluation (Signed)
Clinical/Bedside Swallow Evaluation Patient Details  Name: John Reeves MRN: [id number] Date of Birth: Apr 14, 1929  Today's Date: 09/21/2016 Time: SLP Start Time (ACUTE ONLY): 0740 SLP Stop Time (ACUTE ONLY): 0800 SLP Time Calculation (min) (ACUTE ONLY): 20 min  Past Medical History:  Past Medical History:  Diagnosis Date  . Dementia   . Stroke [facility])    Past Surgical History: History reviewed. No pertinent surgical history. HPI:  John Reeves is a 83 y.o. 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  Md indicates pt may progress to rhabdo but w/ aggressive hydration and mobilization this likely to be prevented. Pt also likely suffered a seizure vs a stroke and is severely demented. Lengthy discussion had w/ pts daughter and son in law regarding John Reeves. At this time we will attempt to obtian an MRI and EEG but aware that pt may not tolerate, at which point aggressive workup measures will be discontinued.  SLP ordered to evaluate risk of pts home eating habits w/ regards to possible aspiration. Per MD, goal is minimizing pt discomfort and irritation. MRI showed remote cerebellar CVA, nothing acute.  Pt passed RNSSS but overt concerns present from RN- pt masticating pills and coughing on liquids.      Assessment / Plan / Recommendation Clinical Impression  Pt presents with gross weakness and likely aspiration of secretions at this time.   Pt with gurgly breathing quality and congested nonproductive cough.  He did follow directions to attempt cough and protrude tongue = allowing oral care.   Oral moisture provided via toothette resulting in congested cough.  Upon review of MD note, goals may be comfort for this pt.    At this time, recommend NPO and oral care for this pt - this will likely be most comforting.  Pt will overtly aspirate with all po intake at this time.  Will follow up next date for po readiness dependent on goals.   SLP Visit  Diagnosis: Dysphagia, oropharyngeal phase (R13.12)    Aspiration Risk  Risk for inadequate nutrition/hydration;Severe aspiration risk    Diet Recommendation NPO (oral care)   Medication Administration: Via alternative means    Other  Recommendations Oral Care Recommendations: Oral care QID   Follow up Recommendations        Frequency and Duration min 2x/week  1 week       Prognosis        Swallow Study   General Date of Onset: 09/21/16 HPI: John Reeves is a 83 y.o. 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  Md indicates pt may progress to rhabdo but w/ aggressive hydration and mobilization this likely to be prevented. Pt also likely suffered a seizure vs a stroke and is severely demented. Lengthy discussion had w/ pts daughter and son in law regarding [place]. At this time we will attempt to obtian an MRI and EEG but aware that pt may not tolerate, at which point aggressive workup measures will be discontinued.  SLP ordered to evaluate risk of pts home eating habits w/ regards to possible aspiration. Per MD, goal is minimizing pt discomfort and irritation. MRI showed remote cerebellar CVA, nothing acute.  Pt passed RNSSS but overt concerns present from RN- pt masticating pills and coughing on liquids.    Type of Study: Bedside Swallow Evaluation Diet Prior to this Study: NPO Temperature Spikes Noted:  No Respiratory Status: Room air History of Recent Intubation: No Behavior/Cognition: Lethargic/Drowsy Oral Cavity Assessment: Dry Oral Care Completed by SLP: Yes Oral Cavity - Dentition: Adequate natural dentition Self-Feeding Abilities: Total assist Patient Positioning: Upright in bed Baseline Vocal Quality: Low vocal intensity;Wet Volitional Cough: Weak Volitional Swallow: Unable to elicit    Oral/Motor/Sensory Function Overall Oral Motor/Sensory Function: Generalized oral weakness (gross weakness)   Ice Chips Ice  chips: Not tested   Thin Liquid Thin Liquid: Impaired Presentation: Spoon Oral Phase Impairments: Reduced lingual movement/coordination;Reduced labial seal Pharyngeal  Phase Impairments: Suspected delayed Swallow;Cough - Immediate    Nectar Thick Nectar Thick Liquid: Not tested   Honey Thick Honey Thick Liquid: Not tested   Puree Puree: Not tested   Solid   GO   Solid: Not tested        Claudie Fisherman, [place] [facility], [place] SLP [phone]

## 2016-09-21 NOTE — Progress Notes (Signed)
Subjective: Resting comfortably with no further seizures  Exam: Vitals:   09/21/16 0345 09/21/16 0545  BP: (!) 136/99 (!) 143/88  Pulse: (!) 107 (!) 55  Resp: 20 20  Temp: 98.1 F (36.7 C) 98.1 F (36.7 C)    HEENT-  Normocephalic, no lesions, without obvious abnormality.  Normal external eye and conjunctiva.  Normal TM's bilaterally.  Normal auditory canals and external ears. Normal external nose, mucus membranes and septum.  Normal pharynx.    Neuro:  CN: Pupils are equal and round. They are symmetrically reactive from 3-->2 mm. EOMI without nystagmus. Facial sensation is intact to light touch. Face is symmetric at rest with normal strength and mobility. Hearing is intact to conversational voice. Palate elevates symmetrically and uvula is midline. Voice is normal in tone, pitch and quality. Bilateral SCM and trapezii are 5/5. Tongue is midline with normal bulk and mobility.  Motor: Normal bulk, tone, and strength. 5/5 throughout. No drift.  Sensation: Intact to light touch.     Pertinent Labs/Diagnostics: Dilantin was not given thus dilantin was <2.5  Etta Quill PA-C [facility] [phone]  Impression: 1. Possible seizure: Given the fact that his deficits and mentation slowly improved, concern is that he may have had a possible seizure. He is at risk for seizures given his underlying dementia. Acute stroke cannot be completely ruled out, however. 2. Left hemiparesis, resolved 3. Dysarthria, resolved 4. Dementia  Recommendations:  For seizure, recommend loading with phenytoin and will give oral load. Unfortunately this was not given in the ED secondary to him being nothing by mouth. Neurology was not consult dated or called about him being nothing by mouth thus he never received his oral load. This morning IV 1000 mg of fosphenytoin was given to him for a Dilantin load. He can start phenytoin extended release 300 mg daily at bedtime on 09/21/16. Seizure  precautions. MRI of the brain did not show any significant abnormalities. EEG is pending. His underlying dementia places him at increased risk for delirium from all causes and would predict delayed recovery from same. Optimize metabolic status is much as possible. Minimize CNS active medications, specifically benzodiazepines, opiates, and anything with strong anticholinergic properties.    09/21/2016, 10:34 AM

## 2016-09-21 NOTE — Procedures (Signed)
ELECTROENCEPHALOGRAM REPORT  Date of Study: 09/21/2016  Patient's Name: John Reeves MRN: [id number] Date of Birth: 01-15-1929  Referring Provider: Dr. Melba Coon  Clinical History: This is an 83 year old man with possible seizure.  Medications: albuterol (PROVENTIL) (2.5 MG/3ML) 0.083% nebulizer solution 2.5 mg  enoxaparin (LOVENOX) injection 30 mg  famotidine (PEPCID) tablet 20 mg  ferrous sulfate tablet 325 mg  furosemide (LASIX) tablet 20 mg  haloperidol lactate (HALDOL) injection 2 mg  insulin aspart (novoLOG) injection 0-9 Units  levothyroxine (SYNTHROID, LEVOTHROID) tablet 125 mcg  LORazepam (ATIVAN) injection 1 mg  simvastatin (ZOCOR) tablet 20 mg  tamsulosin (FLOMAX) capsule 0.4 mg   Technical Summary: A multichannel digital EEG recording measured by the international 10-20 system with electrodes applied with paste and impedances below 5000 ohms performed as portable with EKG monitoring in a predominantly drowsy and asleep patient.  Hyperventilation and photic stimulation were not performed.  The digital EEG was referentially recorded, reformatted, and digitally filtered in a variety of bipolar and referential montages for optimal display.   Description: The patient is predominantly drowsy and asleep during the recording.  During brief period of wakefulness, there is a poorly sustained 6.5Hz  posterior dominant rhythm that poorly attenuates with eye opening and eye closure. This is admixed with diffuse 4-5 Hz theta and 2-3 Hz delta slowing of the background.  During drowsiness and sleep, there is an increase in theta and delta slowing of the background with poorly formed vertex waves seen.  Hyperventilation and photic stimulation were not performed.  There were no epileptiform discharges or electrographic seizures seen.    EKG lead showed irregular rhythm.   Impression: This predominantly drowsy and asleep  EEG is abnormal due to mild to moderate diffuse slowing of the  background.  Clinical Correlation of the above findings indicates diffuse cerebral dysfunction that is non-specific in etiology and can be seen with hypoxic/ischemic injury, toxic/metabolic encephalopathies, neurodegenerative disorders, medication effect, or with excessive drowsiness.  The absence of epileptiform discharges does not rule out a clinical diagnosis of epilepsy.  Clinical correlation is advised.   Ellouise Newer, M.D.

## 2016-09-21 NOTE — Progress Notes (Signed)
EEG completed, results pending. 

## 2016-09-21 NOTE — Consult Note (Signed)
Consultation Note Date: 09/21/2016   Patient Name: John Reeves  DOB: Jun 17, 1929  MRN: [id number]  Age / Sex: 83 y.o., male  PCP: Nicoletta Dress, MD Referring Physician: Eugenie Filler, MD  Reason for Consultation: Establishing goals of care and Psychosocial/spiritual support  HPI/Patient Profile: 83 y.o. male  admitted on 09/20/2016 with  medical history significant of Afib - not on anticoagulation therapy d/t risk of falls, diabetes, hyperlipidemia,  dementia, pervious history of cerebellar stroke, left rotator cough tear who presented to th ED today via EMS after patient was found unresponsive on th floor in his house.  Patient was last seen normal around 7-8 pm last night and today found on the floor by his daughter around 84 am.  The family reported that since Friday patient was getting progressively confused and hallucinating. He had visual hallucinations about someone breaking int his house,  managed to get his gun that was not loaded and started pointing it to the imaginary person.   Family reports this patient did have minimal memory deficits but was driving and living independently up until two weeks ago.  His wife died on September 24, 2022 night at the hospice facility in [facility]  ED Course: on presentation VS were stable except except mild tachypnea Blood work was notable for mild hypokalemia,  Urine was not suggestive of UTI,   Chest x-ray did not show definite superimposed infiltrate or pulmonary edema Pelvis didn't show any acute abnormality Total CK was 509, troponin 0.04 with top limit 0.08 UDS was negative for benzos or illicit drugs EKG showed A. fib with age-indeterminate and QS in the inferior leads consistent with inferior MI  Patient has declined physically, functionally and cognitively over the last 24 hours.  Family is faced with advanced directive decisions and anticipatory care  needs.   Clinical Assessment and Goals of Care:  This NP Wadie Lessen reviewed medical records, received report from team, assessed the patient and then meet at the patient's bedside along with his daughter, son and SIL  to discuss diagnosis, prognosis, GOC, EOL wishes disposition and options.  A detailed discussion was had today regarding advanced directives.  Concepts specific to code status, artifical feeding and hydration, continued IV antibiotics and rehospitalization was had.  The difference between a aggressive medical intervention path  and a palliative comfort care path for this patient at this time was had.  Values and goals of care important to patient and family were attempted to be elicited.  Concept of Hospice and Palliative Care were discussed  Natural trajectory and expectations at EOL were discussed.  Questions and concerns addressed.   Family encouraged to call with questions or concerns.  PMT will continue to support holistically.   SUMMARY OF RECOMMENDATIONS    Code Status/Advance Care Planning:  DNR   Palliative Prophylaxis:   Aspiration precautions, turn and position  Additional Recommendations (Limitations, Scope, Preferences):  Continue current medical treatments for next 12-24 hours, family is hopeful for improvement.  Family will meet with this NP in  the morning at 1100 to further clarify GOC, it they see no improvement they will likely shift to a comfort path. Family verbalize the patient desired a natural death  Psycho-social/Spiritual:   Emotional support to this family who are heavy in grief after the death of their mother two days ago and face the mortality of their father.   Desire for further Chaplaincy support:no  Additional Recommendations: information on hospice  Prognosis:   Re-evaluate in the morning  Discharge Planning: to be determined     Primary Diagnoses: Present on Admission: **None**   I have reviewed the medical  record, interviewed the patient and family, and examined the patient. The following aspects are pertinent.  Past Medical History:  Diagnosis Date  . Dementia   . Stroke [facility])    Social History   Social History  . Marital status: Married    Spouse name: N/A  . Number of children: N/A  . Years of education: N/A   Social History Main Topics  . Smoking status: Former Research scientist (life sciences)  . Smokeless tobacco: Never Used  . Alcohol use No  . Drug use: Unknown  . Sexual activity: Not Asked   Other Topics Concern  . None   Social History Narrative  . None   No family history on file. Scheduled Meds: . enoxaparin (LOVENOX) injection  30 mg Subcutaneous Q24H  . famotidine  20 mg Oral BID  . ferrous sulfate  325 mg Oral Daily  . furosemide  20 mg Oral Daily  . insulin aspart  0-9 Units Subcutaneous TID WC  . levothyroxine  125 mcg Oral QAC breakfast  . simvastatin  20 mg Oral QPM  . tamsulosin  0.4 mg Oral QPM   Continuous Infusions: . 0.9 % NaCl with KCl 20 mEq / L 125 mL/hr at 09/21/16 0940   PRN Meds:.acetaminophen **OR** acetaminophen, albuterol, haloperidol lactate, LORazepam, ondansetron **OR** ondansetron (ZOFRAN) IV, polyethylene glycol Medications Prior to Admission:  Prior to Admission medications   Medication Sig Start Date End Date Taking? Authorizing Provider  albuterol (PROVENTIL HFA;VENTOLIN HFA) 108 (90 Base) MCG/ACT inhaler Inhale 2 puffs into the lungs every 6 (six) hours as needed for wheezing or shortness of breath.    Yes Historical Provider, MD  Cholecalciferol (VITAMIN D PO) Take 1 capsule by mouth daily.   Yes Historical Provider, MD  clonazePAM (KLONOPIN) 0.5 MG tablet Take 0.5 mg by mouth 2 (two) times daily as needed for anxiety.  09/17/16  Yes Historical Provider, MD  Cyanocobalamin (VITAMIN B-12 PO) Take 1 tablet by mouth daily.   Yes Historical Provider, MD  famotidine (PEPCID) 20 MG tablet Take 20 mg by mouth 2 (two) times daily.   Yes Historical Provider, MD    ferrous sulfate 325 (65 FE) MG tablet Take 325 mg by mouth daily.   Yes Historical Provider, MD  furosemide (LASIX) 20 MG tablet Take 20 mg by mouth daily.   Yes Historical Provider, MD  levothyroxine (SYNTHROID, LEVOTHROID) 125 MCG tablet Take 125 mcg by mouth daily before breakfast.   Yes Historical Provider, MD  metFORMIN (GLUCOPHAGE) 500 MG tablet Take 500 mg by mouth daily.   Yes Historical Provider, MD  NON FORMULARY 1 each by Other route See admin instructions. CPAP nightly   Yes Historical Provider, MD  simvastatin (ZOCOR) 20 MG tablet Take 20 mg by mouth every evening.   Yes Historical Provider, MD  tamsulosin (FLOMAX) 0.4 MG CAPS capsule Take 0.4 mg by mouth every evening.   Yes  Historical Provider, MD   No Known Allergies Review of Systems  Unable to perform ROS: Mental status change    Physical Exam  Constitutional: He appears well-developed. He appears ill.  Minimally responsive  HENT:  Audible throat secretions, too weak to clear  Cardiovascular: Normal rate, regular rhythm and normal heart sounds.   Pulmonary/Chest: He has decreased breath sounds in the right lower field and the left lower field.  Skin: Skin is warm and dry.    Vital Signs: BP (!) 171/100 (BP Location: Right Arm)   Pulse 95   Temp 97.9 F (36.6 C) (Oral)   Resp (!) 22   SpO2 97%  Pain Assessment: 0-10   Pain Score: Asleep   SpO2: SpO2: 97 % O2 Device:SpO2: 97 % O2 Flow Rate: .O2 Flow Rate (L/min): 3 L/min  IO: Intake/output summary:  Intake/Output Summary (Last 24 hours) at 09/21/16 1449 Last data filed at 09/21/16 0400  Gross per 24 hour  Intake              675 ml  Output                0 ml  Net              675 ml    LBM:   Baseline Weight:   Most recent weight:       Palliative Assessment/Data:  20 % currently   Discussed with Dr Grandville Silos  Time In: 1530 Time Out: 1645 Time Total: 75 min Greater than 50%  of this time was spent counseling and coordinating care related to  the above assessment and plan.  Signed by: Wadie Lessen, NP   Please contact Palliative Medicine Team phone at [phone] for questions and concerns.  For individual provider: See Shea Evans

## 2016-09-21 NOTE — Care Management Note (Signed)
Case Management Note  Patient Details  Name: BOGDAN VIVONA MRN: [id number] Date of Birth: 08-May-1929  Subjective/Objective:          Patient was admitted with probable seizure and fall, resulting in rhabdomylosis. Recently widowed, he lives at home alone with son living next door.  CM will follow for discharge needs pending patient's progress and physician orders.       Action/Plan:   Expected Discharge Date:                  Expected Discharge Plan:     In-House Referral:     Discharge planning Services     Post Acute Care Choice:    Choice offered to:     DME Arranged:    DME Agency:     HH Arranged:    HH Agency:     Status of Service:     If discussed at [employer] of Stay Meetings, dates discussed:    Additional Comments:  Rolm Baptise, RN 09/21/2016, 11:10 AM

## 2016-09-21 NOTE — Progress Notes (Signed)
PROGRESS NOTE    John Reeves  SFK:[id number] DOB: 08-08-1928 DOA: 09/20/2016 PCP: Nicoletta Dress, MD   Brief Narrative:  Patient is a 83 year old gentleman history of chronic atrial fibrillation not on anticoagulation due to increased risk of falls, diabetes, hyperlipidemia, dementia, prior history of cerebellar stroke presented to the ED after being found unresponsive on the floor at home.   Assessment & Plan:   Principal Problem:   Encephalopathy acute Active Problems:   Dementia   Fall due to seizure [facility])   Atrial fibrillation, chronic (HCC)   Diabetes mellitus ([place])   Slurred speech   Diabetes mellitus with complication ([place])   Visual hallucination   Elevated CK   Muscle injury  #1 acute encephalopathy/? Possible seizures ?? Etiology. Concern for possible seizure. Patient noted to be at risk for seizures secondary to underlying dementia. Acute CVA cannot be completely ruled out. CT head negative. MRI brain was done however was limited secondary to patient unable to hold still. Patient with some dysarthric speech. Patient currently nothing by mouth and a such did not receive Dilantin on admission. EEG pending. Patient currently afebrile. Normal white count. Urinalysis is nitrite negative, leukocytes negative. No signs or symptoms of infection. Patient has been loaded with IV phenytoin and placed on IV maintenance doses per neurology recommendations. Will limit CNS depressive medications for now. Repeat chest x-ray in the morning. Repeat UA with cultures and sensitivities. Follow.  #2 dementia Per family patient was very active with a recent decline in his mental status over the past several days. Supportive treatment. Neurology following.  #3 chronic atrial fibrillation Currently rate controlled. Due to high fall risk patient on anticoagulation candidate.  #4 elevated CK Likely secondary to fall. Continue hydration. Hold diuretics. Follow.  #5 diabetes mellitus  II Discontinue Glucophage. Place on sliding scale insulin.  #6 dehydration IV fluids.  #7 prognosis Patient with a poor prognosis. Patient has been seen by speech therapy and patient likely chronically aspirating. Patient also with a history of dementia. Patient slowly declining over the past few months. Patient's wife recently died one day prior to admission. Palliative care consultation.    DVT prophylaxis: Lovenox Code Status: DO NOT RESUSCITATE Family Communication: Updated family at bedside. Disposition Plan: Pending palliative care evaluation.   Consultants:   Neurology: Dr.Oster 09/20/2016  Palliative care  Procedures:   EEG 09/21/2016  Chest x-ray 09/20/2016  Plain films of the pelvis 09/20/2016  Limited MRI brain 09/20/2016  CT head CT C-spine 09/20/2016  Antimicrobials:   None   Subjective: Patient drowsy however opens eyes to verbal stimuli. Unintelligible speech.  Objective: Vitals:   09/21/16 0345 09/21/16 0545 09/21/16 0930 09/21/16 1416  BP: (!) 136/99 (!) 143/88 (!) 171/100 (!) 139/94  Pulse: (!) 107 (!) 55 95 81  Resp: 20 20 (!) 22 20  Temp: 98.1 F (36.7 C) 98.1 F (36.7 C) 97.9 F (36.6 C) 98.2 F (36.8 C)  TempSrc: Oral Oral Oral Oral  SpO2: 94% 97% 97% 94%    Intake/Output Summary (Last 24 hours) at 09/21/16 1911 Last data filed at 09/21/16 1600  Gross per 24 hour  Intake          1466.67 ml  Output                0 ml  Net          1466.67 ml   There were no vitals filed for this visit.  Examination:  General exam: Appears calm and comfortable  Respiratory system: Clear to auscultation anterior lung fields . Respiratory effort normal. Cardiovascular system: S1 & S2 heard, RRR. No JVD, murmurs, rubs, gallops or clicks. No pedal edema. Gastrointestinal system: Abdomen is nondistended, soft and nontender. No organomegaly or masses felt. Normal bowel sounds heard. Central nervous system:  somewhat Alert. Due to drowsiness  cannot fully evaluate for neurological examination. Moving extremities spontaneously. Patient with some unintelligible speech.  Extremities: Symmetric 5 x 5 power. Skin: No rashes, lesions or ulcers Psychiatry: Unable to assess judgement and insight. Unable to assess mood & affect.     Data Reviewed: I have personally reviewed following labs and imaging studies  CBC:  Recent Labs Lab 09/20/16 1220 09/20/16 1231  WBC 7.5  --   NEUTROABS 5.9  --   HGB 14.4 14.3  HCT 43.4 42.0  MCV 93.9  --   PLT 147*  --    Basic Metabolic Panel:  Recent Labs Lab 09/20/16 1220 09/20/16 1231 09/21/16 0920  NA 142 142 141  K 3.5 3.4* 3.6  CL 104 100* 106  CO2 29  --  26  GLUCOSE 112* 107* 83  BUN 10 11 11   CREATININE 0.84 0.70 0.74  CALCIUM 8.9  --  8.4*  MG  --   --  2.0   GFR: CrCl cannot be calculated (Unknown ideal weight.). Liver Function Tests:  Recent Labs Lab 09/20/16 1220 09/21/16 0334  AST 27  --   ALT 12*  --   ALKPHOS 46  --   BILITOT 1.8*  --   PROT 6.2*  --   ALBUMIN 3.9 3.5   No results for input(s): LIPASE, AMYLASE in the last 168 hours. No results for input(s): AMMONIA in the last 168 hours. Coagulation Profile:  Recent Labs Lab 09/20/16 1220  INR 1.45   Cardiac Enzymes:  Recent Labs Lab 09/20/16 1220 09/21/16 0334  CKTOTAL 509* 657*   BNP (last 3 results) No results for input(s): PROBNP in the last 8760 hours. HbA1C: No results for input(s): HGBA1C in the last 72 hours. CBG:  Recent Labs Lab 09/21/16 0019 09/21/16 0347 09/21/16 0945 09/21/16 1147 09/21/16 1636  GLUCAP 105* 94 71 76 79   Lipid Profile: No results for input(s): CHOL, HDL, LDLCALC, TRIG, CHOLHDL, LDLDIRECT in the last 72 hours. Thyroid Function Tests: No results for input(s): TSH, T4TOTAL, FREET4, T3FREE, THYROIDAB in the last 72 hours. Anemia Panel: No results for input(s): VITAMINB12, FOLATE, FERRITIN, TIBC, IRON, RETICCTPCT in the last 72 hours. Sepsis  Labs:  Recent Labs Lab 09/20/16 1232  LATICACIDVEN 1.59    No results found for this or any previous visit (from the past 240 hour(s)).       Radiology Studies: Dg Chest 1 View  Result Date: 09/20/2016 CLINICAL DATA:  Fall, stroke-like symptoms EXAM: CHEST 1 VIEW COMPARISON:  09/16/2016 FINDINGS: Cardiomediastinal silhouette is stable. Large hiatal hernia again noted. Central vascular congestion and probable chronic interstitial prominence again noted. Mild degenerative changes bilateral shoulders. No definite superimposed infiltrate or pulmonary edema. Mild basilar atelectasis. IMPRESSION: Large hiatal hernia again noted. Central vascular congestion and probable chronic interstitial prominence again noted. Mild degenerative changes bilateral shoulders. No definite superimposed infiltrate or pulmonary edema. Electronically Signed   By: Lahoma Crocker M.D.   On: 09/20/2016 12:46   Dg Pelvis 1-2 Views  Result Date: 09/20/2016 CLINICAL DATA:  Stroke.  Found on the floor. EXAM: PELVIS - 1-2 VIEW COMPARISON:  Pelvis CT dated 11/30/2007. FINDINGS: Normal appearing pelvic bones and hips.  No fracture or dislocation seen. Lower lumbar spine degenerative changes and scoliosis. Atheromatous arterial calcifications, including the abdominal aorta. IMPRESSION: No acute abnormality.  Aortic atherosclerosis. Electronically Signed   By: Claudie Revering M.D.   On: 09/20/2016 12:46   Ct Head Wo Contrast  Result Date: 09/20/2016 CLINICAL DATA:  83 year old male with slurred speech. Left-sided weakness. Fall. Dementia. Initial encounter. EXAM: CT HEAD WITHOUT CONTRAST CT CERVICAL SPINE WITHOUT CONTRAST TECHNIQUE: Multidetector CT imaging of the head and cervical spine was performed following the standard protocol without intravenous contrast. Multiplanar CT image reconstructions of the cervical spine were also generated. COMPARISON:  09/16/2016 head CT. 04/06/2016 brain MR. 07/06/2011 plain film exam of the cervical  spine. FINDINGS: CT HEAD FINDINGS Brain: No intracranial hemorrhage or CT evidence of large acute infarct. Remote inferior right cerebellar infarct. Moderate chronic microvascular changes. Moderate global atrophy without hydrocephalus. No intracranial mass lesion noted on this unenhanced exam. Vascular: Vascular calcifications. Skull: No skull fracture. Sinuses/Orbits: Post lens replacement without acute orbital abnormality. Mild mucosal thickening ethmoid sinus air cells bilaterally. Other: Negative. CT CERVICAL SPINE FINDINGS Alignment: Mild curvature.  Minimal anterior slip C2 and C7. Skull base and vertebrae: No cervical spine fracture Soft tissues and spinal canal: No abnormal prevertebral soft tissue swelling. Disc levels: Cervical spondylotic changes most notable C4-5 thru C6-7. Upper chest: Scarring. Other: Carotid bifurcation calcifications. IMPRESSION: CT HEAD No skull fracture or intracranial hemorrhage. No CT evidence of large acute infarct. Remote inferior right cerebellar infarct. Moderate chronic microvascular changes. Moderate global atrophy without hydrocephalus. CT CERVICAL SPINE No cervical spine fracture or abnormal prevertebral soft tissue swelling. Cervical spondylotic changes most notable C4-5 thru C6-7. Electronically Signed   By: Genia Del M.D.   On: 09/20/2016 13:16   Ct Cervical Spine Wo Contrast  Result Date: 09/20/2016 CLINICAL DATA:  83 year old male with slurred speech. Left-sided weakness. Fall. Dementia. Initial encounter. EXAM: CT HEAD WITHOUT CONTRAST CT CERVICAL SPINE WITHOUT CONTRAST TECHNIQUE: Multidetector CT imaging of the head and cervical spine was performed following the standard protocol without intravenous contrast. Multiplanar CT image reconstructions of the cervical spine were also generated. COMPARISON:  09/16/2016 head CT. 04/06/2016 brain MR. 07/06/2011 plain film exam of the cervical spine. FINDINGS: CT HEAD FINDINGS Brain: No intracranial hemorrhage or CT  evidence of large acute infarct. Remote inferior right cerebellar infarct. Moderate chronic microvascular changes. Moderate global atrophy without hydrocephalus. No intracranial mass lesion noted on this unenhanced exam. Vascular: Vascular calcifications. Skull: No skull fracture. Sinuses/Orbits: Post lens replacement without acute orbital abnormality. Mild mucosal thickening ethmoid sinus air cells bilaterally. Other: Negative. CT CERVICAL SPINE FINDINGS Alignment: Mild curvature.  Minimal anterior slip C2 and C7. Skull base and vertebrae: No cervical spine fracture Soft tissues and spinal canal: No abnormal prevertebral soft tissue swelling. Disc levels: Cervical spondylotic changes most notable C4-5 thru C6-7. Upper chest: Scarring. Other: Carotid bifurcation calcifications. IMPRESSION: CT HEAD No skull fracture or intracranial hemorrhage. No CT evidence of large acute infarct. Remote inferior right cerebellar infarct. Moderate chronic microvascular changes. Moderate global atrophy without hydrocephalus. CT CERVICAL SPINE No cervical spine fracture or abnormal prevertebral soft tissue swelling. Cervical spondylotic changes most notable C4-5 thru C6-7. Electronically Signed   By: Genia Del M.D.   On: 09/20/2016 13:16   Mr Brain Wo Contrast  Result Date: 09/20/2016 CLINICAL DATA:  Initial evaluation for acute seizure. EXAM: MRI HEAD WITHOUT CONTRAST TECHNIQUE: Multiplanar, multiecho pulse sequences of the brain and surrounding structures were obtained without intravenous contrast. COMPARISON:  Prior CT from 09/20/2016. FINDINGS: Brain: Study severely limited as the patient was unable to tolerate the full length of the exam. Only diffusion-weighted sequences and axial FLAIR sequence was performed. Additionally, images provided are fairly degraded by motion artifact. Diffusion-weighted imaging demonstrates no evidence for acute infarct. Gray-white matter differentiation grossly maintained. No other definite  acute intracranial process. Cerebral volume within normal limits for age. No significant cerebral white matter disease for age identified. Remote right cerebellar infarct noted. No mass lesion, midline shift or mass effect. No hydrocephalus. No extra-axial fluid collection. Vascular: Grossly maintained, but not well evaluated on this limited exam. Skull and upper cervical spine: Grossly unremarkable, but not well evaluated on this limited exam. Sinuses/Orbits: Globes and oval soft tissues grossly within normal limits. Paranasal sinuses grossly clear. Other: No other significant finding. IMPRESSION: 1. Limited study as the patient was unable to tolerate the full length of the exam. Additionally, the images provided are markedly degraded by motion artifact. 2. No acute intracranial infarct. No other definite acute intracranial process. 3. Remote right inferior cerebellar infarct. 4. If there is high clinical suspicion for a possible occult intracranial process. Repeat imaging when the patient is able to tolerate the study and hold still is suggested. Electronically Signed   By: Jeannine Boga M.D.   On: 09/20/2016 21:14        Scheduled Meds: . enoxaparin (LOVENOX) injection  30 mg Subcutaneous Q24H  . famotidine  20 mg Oral BID  . ferrous sulfate  325 mg Oral Daily  . furosemide  20 mg Oral Daily  . insulin aspart  0-9 Units Subcutaneous TID WC  . levothyroxine  125 mcg Oral QAC breakfast  . simvastatin  20 mg Oral QPM  . tamsulosin  0.4 mg Oral QPM   Continuous Infusions: . 0.9 % NaCl with KCl 20 mEq / L 125 mL/hr at 09/21/16 1740     LOS: 1 day    Time spent: 60 minutes    Jawanna Dykman, MD [facility] Pager [phone]  If 7PM-7AM, please contact night-coverage [email or web address] Password [email or web address] 09/21/2016, 7:11 PM

## 2016-09-22 ENCOUNTER — Inpatient Hospital Stay (HOSPITAL_COMMUNITY): Payer: Medicare Other

## 2016-09-22 DIAGNOSIS — R748 Abnormal levels of other serum enzymes: Secondary | ICD-10-CM

## 2016-09-22 DIAGNOSIS — R451 Restlessness and agitation: Secondary | ICD-10-CM

## 2016-09-22 DIAGNOSIS — K117 Disturbances of salivary secretion: Secondary | ICD-10-CM

## 2016-09-22 LAB — CBC WITH DIFFERENTIAL/PLATELET
Basophils Absolute: 0 10*3/uL (ref 0.0–0.1)
Basophils Relative: 0 %
EOS ABS: 0 10*3/uL (ref 0.0–0.7)
Eosinophils Relative: 0 %
HEMATOCRIT: 44.3 % (ref 39.0–52.0)
HEMOGLOBIN: 14.2 g/dL (ref 13.0–17.0)
LYMPHS ABS: 0.7 10*3/uL (ref 0.7–4.0)
Lymphocytes Relative: 9 %
MCH: 30.6 pg (ref 26.0–34.0)
MCHC: 32.1 g/dL (ref 30.0–36.0)
MCV: 95.5 fL (ref 78.0–100.0)
MONOS PCT: 8 %
Monocytes Absolute: 0.6 10*3/uL (ref 0.1–1.0)
NEUTROS ABS: 6 10*3/uL (ref 1.7–7.7)
NEUTROS PCT: 83 %
Platelets: 163 10*3/uL (ref 150–400)
RBC: 4.64 MIL/uL (ref 4.22–5.81)
RDW: 13.8 % (ref 11.5–15.5)
WBC: 7.2 10*3/uL (ref 4.0–10.5)

## 2016-09-22 LAB — BASIC METABOLIC PANEL
Anion gap: 11 (ref 5–15)
BUN: 13 mg/dL (ref 6–20)
CHLORIDE: 102 mmol/L (ref 101–111)
CO2: 27 mmol/L (ref 22–32)
Calcium: 8.3 mg/dL — ABNORMAL LOW (ref 8.9–10.3)
Creatinine, Ser: 0.77 mg/dL (ref 0.61–1.24)
GFR calc Af Amer: >60 mL/min (ref >60)
GFR calc non Af Amer: >60 mL/min (ref >60)
Glucose, Bld: 99 mg/dL (ref 65–99)
POTASSIUM: 3.8 mmol/L (ref 3.5–5.1)
SODIUM: 140 mmol/L (ref 135–145)

## 2016-09-22 LAB — URINALYSIS, ROUTINE W REFLEX MICROSCOPIC
Bilirubin Urine: NEGATIVE
Glucose, UA: NEGATIVE mg/dL
KETONES UR: 20 mg/dL — AB
Leukocytes, UA: NEGATIVE
NITRITE: NEGATIVE
PROTEIN: 30 mg/dL — AB
SQUAMOUS EPITHELIAL / LPF: NONE SEEN
Specific Gravity, Urine: 1.016 (ref 1.005–1.030)
pH: 5 (ref 5.0–8.0)

## 2016-09-22 LAB — GLUCOSE, CAPILLARY: Glucose-Capillary: 97 mg/dL (ref 65–99)

## 2016-09-22 LAB — HEMOGLOBIN A1C
Hgb A1c MFr Bld: 6.4 % — ABNORMAL HIGH (ref 4.8–5.6)
Mean Plasma Glucose: 137 mg/dL

## 2016-09-22 LAB — CK: CK TOTAL: 386 U/L (ref 49–397)

## 2016-09-22 LAB — TSH: TSH: 0.236 u[IU]/mL — AB (ref 0.350–4.500)

## 2016-09-22 MED ORDER — ONDANSETRON HCL 4 MG PO TABS: 4.0000 mg | ORAL_TABLET | Freq: Four times a day (QID) | ORAL | 0 refills | Status: AC | PRN

## 2016-09-22 MED ORDER — BISACODYL 10 MG RE SUPP: 10.0000 mg | Freq: Every day | RECTAL | 0 refills | Status: AC | PRN

## 2016-09-22 MED ORDER — MORPHINE SULFATE (CONCENTRATE) 10 MG/0.5ML PO SOLN
5.0000 mg | ORAL | Status: DC | PRN
  Administered 2016-09-22: 5 mg via ORAL
  Filled 2016-09-22: qty 0.5

## 2016-09-22 MED ORDER — PHENYTOIN SODIUM 50 MG/ML IJ SOLN
100.0000 mg | Freq: Three times a day (TID) | INTRAMUSCULAR | Status: DC
  Administered 2016-09-22: 100 mg via INTRAVENOUS
  Filled 2016-09-22 (×2): qty 2

## 2016-09-22 MED ORDER — LORAZEPAM 1 MG PO TABS
2.0000 mg | ORAL_TABLET | ORAL | Status: DC | PRN
  Administered 2016-09-22: 2 mg via ORAL
  Filled 2016-09-22: qty 2

## 2016-09-22 MED ORDER — METOPROLOL TARTRATE 5 MG/5ML IV SOLN
5.0000 mg | Freq: Four times a day (QID) | INTRAVENOUS | Status: DC
Start: 2016-09-22 — End: 2016-09-22
  Administered 2016-09-22: 5 mg via INTRAVENOUS
  Filled 2016-09-22: qty 5

## 2016-09-22 MED ORDER — MORPHINE SULFATE (CONCENTRATE) 10 MG/0.5ML PO SOLN: 5.0000 mg | ORAL | 0 refills | Status: AC | PRN

## 2016-09-22 MED ORDER — SCOPOLAMINE 1 MG/3DAYS TD PT72: 1.0000 | MEDICATED_PATCH | TRANSDERMAL | 12 refills | Status: AC

## 2016-09-22 MED ORDER — HALOPERIDOL LACTATE 5 MG/ML IJ SOLN
2.0000 mg | INTRAMUSCULAR | Status: DC
  Administered 2016-09-22: 2 mg via INTRAVENOUS
  Filled 2016-09-22: qty 1

## 2016-09-22 MED ORDER — LORAZEPAM 2 MG PO TABS: 2.0000 mg | ORAL_TABLET | ORAL | 0 refills | Status: AC | PRN

## 2016-09-22 MED ORDER — HALOPERIDOL LACTATE 5 MG/ML IJ SOLN
2.0000 mg | Freq: Four times a day (QID) | INTRAMUSCULAR | Status: DC
  Administered 2016-09-22: 2 mg via INTRAVENOUS

## 2016-09-22 MED ORDER — SCOPOLAMINE 1 MG/3DAYS TD PT72
1.0000 | MEDICATED_PATCH | TRANSDERMAL | Status: DC
  Administered 2016-09-22: 1.5 mg via TRANSDERMAL
  Filled 2016-09-22: qty 1

## 2016-09-22 MED ORDER — BISACODYL 10 MG RE SUPP: 10.0000 mg | Freq: Every day | RECTAL | Status: DC | PRN

## 2016-09-22 MED ORDER — MORPHINE SULFATE (CONCENTRATE) 10 MG/0.5ML PO SOLN: 5.0000 mg | ORAL | Status: DC | PRN

## 2016-09-22 NOTE — Progress Notes (Signed)
  Speech Language Pathology Treatment: Dysphagia  Patient Details Name: John Reeves MRN: [id number] DOB: 1928/09/29 Today's Date: 09/22/2016 Time: 3007-6226 SLP Time Calculation (min) (ACUTE ONLY): 28 min  Assessment / Plan / Recommendation Clinical Impression  Pt more alert today - however continues with congested cough -and dysarthric speech.   Pt has islands of clear speech with him stating "For ten dollars" when asked to open his mouth.  However 75% of speech is incomprehensible with rapid rate, decreased phonatory strength and imprecise articulation.   SLP provided oral care - resulting in pt with minimal bleeding from suction - Asked RN/secretary for regulator on suction that worked - as suction can not be turned down from high.  RN made aware and bleeding had ceased when SLP left room  Pt administered ice chips and oral moisture via toothette.  Delayed mastication of ice and weak cough noted immediately post=swallow concerning for aspiration.     At this time, pt continues to be high aspiration risk with any po and even of secretions at this time.  Note possible plans for comfort care if he does not improve.  Recommend continue NPO and adequate oral care.     HPI HPI: John Reeves is a 83 y.o. 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  Md indicates pt may progress to rhabdo but w/ aggressive hydration and mobilization this likely to be prevented. Pt also likely suffered a seizure vs a stroke and is severely demented. Lengthy discussion had w/ pts daughter and son in law regarding [place]. At this time we will attempt to obtian an MRI and EEG but aware that pt may not tolerate, at which point aggressive workup measures will be discontinued.  SLP ordered to evaluate risk of pts home eating habits w/ regards to possible aspiration. Per MD, goal is minimizing pt discomfort and irritation. MRI showed remote cerebellar CVA, nothing acute.   Pt passed RNSSS but overt concerns present from RN- pt masticating pills and coughing on liquids.         SLP Plan          Recommendations  Diet recommendations: NPO (oral care) Medication Administration: Via alternative means                Oral Care Recommendations: Oral care QID SLP Visit Diagnosis: Dysphagia, oropharyngeal phase (R13.12)       [place], [place] SLP [phone]

## 2016-09-22 NOTE — Progress Notes (Signed)
Daily Progress Note   Patient Name: John Reeves       Date: 09/22/2016 DOB: 08-27-1928  Age: 83 y.o. MRN#: [id number] Attending Physician: Eugenie Filler, MD Primary Care Physician: Nicoletta Dress, MD Admit Date: 09/20/2016  Reason for Consultation/Follow-up: Establishing goals of care, Non pain symptom management and Psychosocial/spiritual support  Subjective:  - patient is minimally responsive, easily agitated and continued to decline physically, functionally and cognitively  -meet with family to again discussed [place], prognosis and EOL wishes  -family understand the limited prognosis, days to weeks,  with no life prolonging measures  - they hope for comfort and dignity and support medications for symptom management  Length of Stay: 2  Current Medications: Scheduled Meds:  . enoxaparin (LOVENOX) injection  30 mg Subcutaneous Q24H  . famotidine (PEPCID) IV  20 mg Intravenous Q12H  . insulin aspart  0-9 Units Subcutaneous TID WC  . levothyroxine  62.5 mcg Intravenous QAC breakfast  . phenytoin (DILANTIN) IV  100 mg Intravenous Q8H    Continuous Infusions: . 0.9 % NaCl with KCl 20 mEq / L 50 mL/hr at 09/22/16 1026    PRN Meds: acetaminophen **OR** acetaminophen, albuterol, haloperidol lactate, LORazepam, metoprolol, ondansetron **OR** ondansetron (ZOFRAN) IV, polyethylene glycol  Physical Exam  Constitutional: He appears well-developed. He appears ill.  agitated and minimally responsive  HENT:  Mouth/Throat: Mucous membranes are dry.  Cardiovascular: Normal rate, regular rhythm and normal heart sounds.   Pulmonary/Chest: He has decreased breath sounds in the right lower field and the left lower field.  Skin: Skin is warm and dry.            Vital Signs: BP (!) 179/109  (BP Location: Right Arm) Comment: RN Notified  Pulse 82   Temp 98.9 F (37.2 C) (Oral)   Resp 20   SpO2 100%  SpO2: SpO2: 100 % O2 Device: O2 Device: Nasal Cannula O2 Flow Rate: O2 Flow Rate (L/min): 3 L/min  Intake/output summary:  Intake/Output Summary (Last 24 hours) at 09/22/16 1136 Last data filed at 09/22/16 0624  Gross per 24 hour  Intake          2329.17 ml  Output              250 ml  Net  2079.17 ml   LBM:   Baseline Weight:   Most recent weight:         Palliative Assessment/Data: 20 %    Flowsheet Rows     Most Recent Value  Intake Tab  Referral Department  Hospitalist  Unit at Time of Referral  Med/Surg Unit  Palliative Care Primary Diagnosis  Neurology  Date Notified  09/20/16  Palliative Care Type  New Palliative care  Reason for referral  Clarify Goals of Care  Date of Admission  09/20/16  Date first seen by Palliative Care  09/21/16  # of days Palliative referral response time  1 Day(s)  # of days IP prior to Palliative referral  0  Clinical Assessment  Palliative Performance Scale Score  20%  Psychosocial & Spiritual Assessment  Palliative Care Outcomes      Patient Active Problem List   Diagnosis Date Noted  . Encephalopathy acute 09/21/2016  . Seizure ([place])   . Palliative care by specialist   . DNR (do not resuscitate)   . Rhabdomyolysis 09/20/2016  . Dementia 09/20/2016  . Fall due to seizure ([place]) 09/20/2016  . Atrial fibrillation, chronic ([place]) 09/20/2016  . Diabetes mellitus ([place]) 09/20/2016  . Elevated CK 09/20/2016  . Muscle injury 09/20/2016  . Slurred speech   . Diabetes mellitus with complication ([place])   . Visual hallucination     Palliative Care Assessment & Plan   Patient Profile: 83 y.o. male  admitted on 09/20/2016 with  medical history significant of Afib - not on anticoagulation therapy d/t risk of falls, diabetes, hyperlipidemia, dementia, pervious history of cerebellar stroke, left rotator cough tear who  presented to th ED today via EMS after patient was found unresponsive on th floor in his house.  Patient was last seen normal around 7-8 pm last night and today found on the floor by his daughter around 6 am.  The family reported that since Friday patient was getting progressively confused and hallucinating. He had visual hallucinations about someone breaking int his house, managed to get his gun that was not loaded and started pointing it to the imaginary person.   Family reports this patient did have minimal memory deficits but was driving and living independently up until two weeks ago.  His wife died on 2022/09/26 night at the hospice facility in [facility].  Family face EOL decisions for Mr Denham  Assessment:  - transitioning at EOL, focus comfort and dignity   Recommendations/Plan:   Comfort and dignity  Symptom management: Dyspnea, Agitation, terminal secretions  No life prolonging mesures  Goals of Care and Additional Recommendations:  Limitations on Scope of Treatment: Full Comfort Care  Code Status:    Code Status Orders        Start     Ordered   09/20/16 1747  Do not attempt resuscitation (DNR)  Continuous    Question Answer Comment  In the event of cardiac or respiratory ARREST Do not call a "code blue"   In the event of cardiac or respiratory ARREST Do not perform Intubation, CPR, defibrillation or ACLS   In the event of cardiac or respiratory ARREST Use medication by any route, position, wound care, and other measures to relive pain and suffering. May use oxygen, suction and manual treatment of airway obstruction as needed for comfort.      09/20/16 1753    Code Status History    Date Active Date Inactive Code Status Order ID Comments User Context   This  patient has a current code status but no historical code status.       Prognosis:   < 2 weeks   Discussed natural trajectory and expectations at EOL  Discharge Planning:  Hospice  facility  Care plan was discussed with Dr Grandville Silos   Thank you for allowing the Palliative Medicine Team to assist in the care of this patient.   Time In: 1130 Time Out: 1230 Total Time 60 mnin Prolonged Time Billed  no       Greater than 50%  of this time was spent counseling and coordinating care related to the above assessment and plan.  Wadie Lessen, NP  Please contact Palliative Medicine Team phone at [phone] for questions and concerns.

## 2016-09-22 NOTE — Clinical Social Work Note (Signed)
CSW consulted for hospice placement. CSW spoke with [facility] with Palliative Care and at this time pt is appropriate for hospice placement. Per [facility] family wants pt go to [facility]. CSW spoke with [facility] and they are prepared to take pt today. CSW will send clincials and d/c summary once available.   [place], [place]

## 2016-09-22 NOTE — Progress Notes (Signed)
Pt IV and telemetry removed; pt discharge to [facility] and report called off to facility. Pt cleaned, new condom cath intact. Discharge education completed. Awaiting on [facility] to transport pt to disposition. Delia Heady RN

## 2016-09-22 NOTE — Progress Notes (Signed)
Subjective: No further seizures overnight.  Exam: Vitals:   09/22/16 0110 09/22/16 0503  BP: (!) 144/88 (!) 151/86  Pulse: 79 88  Resp: 20 20  Temp: 97.3 F (36.3 C) 98 F (36.7 C)    HEENT-  Normocephalic, no lesions, without obvious abnormality.  Normal external eye and conjunctiva.  Normal TM's bilaterally.  Normal auditory canals and external ears. Normal external nose, mucus membranes and septum.  Normal pharynx.    Neuro:  CN: Patient is holding his eyes shut. When I tried to open his eyes he is forcefully holding them shut showing good strength. Dolls are intact pupils are intact patient does not blink to threat. Patient follows no commands. He is talking however it is more of a mumbling nonsensical speech. Patient does wince to noxious stimuli on bilateral sides. Motor:  patient is moving all extremities with full strength. Sensation: Withdraws to pain in all extremities       Pertinent Labs/Diagnostics: Dilantin orders pending EEG was predominantly drowsy and asleep. It did not show any epileptiform activity.  Etta Quill PA-C [facility] [phone]   A/P:   1. Possible seizure: History as reported is concerning for possible seizure. His underlying dementia places him at risk for seizure. He was unable to get his oral phenytoin load overnight so IV load has been ordered-follow-up Dilantin level was 16.8 thus Dilantin was held. At this time will start Dilantin IV 100 mg every 8 hours and get a level in the morning.  2. Acute encephalopathy: His initial confusion has improved. Family reported to RN last night that he appeared to be at his baseline. Follow. Optimize metabolic status, limit CNS active meds, especially benzos, opiates, and anything with strong anticholinergic properties.   3. Dementia: he has very advanced dementia at baseline. This predisposes him to delirium and encephalopathy from all causes, even seemingly trivial insults or changes.  Treatment as this stage is supportive. Family is pursuing placement.   No family present at the time of my visit. Will continue to follow with you.     09/22/2016, 9:36 AM

## 2016-09-22 NOTE — Consult Note (Signed)
         [facility] CM Primary Care Navigator  09/22/2016  John Reeves 05-17-1929 [id number]   Went to see patient at the bedside to identify possible discharge needs. Met with patient's son and daughter with other family members in the room. Patient has eyes closed and unable to participate with conversation.  Per chart review, patient is with poor prognosis.  He has history of dementia and is slowly declining over the past few months. Patient's wife recently died one day prior to his admission. Palliative care consultation was ordered by MD.  Daughter reports that they are meeting with Palliative Care Team today to talk about Hospice care.  No further health management needed at this time.   For questions, please contact:  Dannielle Huh, BSN, RN- [facility] Primary Care Navigator  Telephone: [phone] [place]

## 2016-09-22 NOTE — Discharge Summary (Signed)
Physician Discharge Summary  KVION SHAPLEY UGQ:[id number] DOB: Nov 01, 1928 DOA: 09/20/2016  PCP: Nicoletta Dress, MD  Admit date: 09/20/2016 Discharge date: 09/22/2016  Time spent: 60 minutes  Recommendations for Outpatient Follow-up:  1. Patient will be discharged to residential hospice home. Patient now for comfort measures.   Discharge Diagnoses:  Principal Problem:   Encephalopathy acute Active Problems:   Dementia   Fall due to seizure [facility])   Atrial fibrillation, chronic (HCC)   Diabetes mellitus ([place])   Slurred speech   Diabetes mellitus with complication ([place])   Visual hallucination   Elevated CK   Muscle injury   Seizure ([place])   Palliative care by specialist   DNR (do not resuscitate)   Discharge Condition: Stable  Diet recommendation: Comfort feeds  There were no vitals filed for this visit.  History of present illness:  Per Dr. Sherle Poe is a 83 y.o. male with medical history significant of Afib - not on anticoagulation therapy d/t risk of falls, diabetes, hyperlipidemia, severe dementia, pervious history of cerebellar stroke, left rotator cough tear who presented to th ED today via EMS after patient was found unresponsive on th floor in his house. Patient was last seen normal around 7-8 pm last night and today found on the floor by his daughter around 35 am. Patient lived with his wife who since the middle of February was in palliative care and died last night. His daughter stepped in to check on him the morning of admission and found him lying on the flor among the scattered staff and incontinent. She called her brother who lives next door to come for help. The son had difficulty waking him up, patient was very groggy and speech  Incoherent, he also was having significant weakness and difficulty ambulating  The family reported that since Friday patient was getting progressively confused and halicionating. He had visual hallucinations about someone breaking  int his house,  managed to get his gun that was not loaded and started pointing it to the imaginary person. Later in the eveninghe was convinced that an intruder occupied his recliner and lifted the regular chair and started hitting recliner with it saying that he would kill that guy for coming in.  ED Course: on presentation VS were stable except except mild tachypnea Blood work was notable for mild hypokalemia,  Urine was not suggestive of UTI,   Chest x-ray did not show definite superimposed infiltrate or pulmonary edema Pelvis didn't show any acute abnormality Total CK was 509, troponin 0.04 with top limit 0.08 UDS was negative for benzos or illicit drugs EKG showed A. fib with age-indeterminate and QS in the inferior leads consistent with inferior MI   Hospital Course:  #1 acute encephalopathy/? Possible seizures ?? Etiology. Concern for possible seizure. Patient noted to be at risk for seizures secondary to underlying dementia. Acute CVA cannot be completely ruled out. CT head negative. MRI brain was done however was limited secondary to patient unable to hold still. Patient with some dysarthric speech. Patient currently nothing by mouth and as such did not receive Dilantin on admission. EEG which was done was predominantly drowsy and asleep. EEG did not show any epileptiform activity. Patient was loaded with IV Dilantin. Patient remained afebrile. Normal white count. Urinalysis was nitrite negative, leukocytes negative. No signs or symptoms of infection. Patient has been loaded with IV phenytoin and placed on IV maintenance doses per neurology recommendations. Will limit CNS depressive medications for now. Repeat chest x-ray  done was negative for any acute infiltrate. Urinalysis was negative. Patient with no significant improvement. Patient remained minimally responsive, easily agitated continue to decline physically, functionally and cognitively. Patient was seen by palliative care met with  the family and decision was made for full comfort measures. Patient will be discharged to a residential hospice home.   #2 dementia Per family patient was very active with a recent decline in his mental status over the past several days. Workup done was negative for any acute infectious process. Limited MRI which was done was negative for any acute infarct. EEG was predominantly drowsy and asleep and did not show any epileptic form activity. Patient was seen by neurology during the hospitalization. Family decided on full comfort measures and patient was discharged to residential hospice home.  #3 chronic atrial fibrillation Patient remained rate controlled. Due to high fall risk patient not an anticoagulation candidate. Family decided on full comfort measures after palliative care meeting and patient was made a full comfort. Patient be discharged to residential hospice home.  #4 elevated CK Likely secondary to fall. Patient was aggressively hydrated with improvement with elevated CK. Patient's diuretics held.  #5 diabetes mellitus II Discontinued Glucophage. Patient was maintained on sliding scale insulin. Patient's family decided on full comfort measures and patient will be discharged to a residential hospice home.   #6 dehydration IV fluids.  #7 prognosis Patient with a poor prognosis. Patient has been seen by speech therapy and patient likely chronically aspirating. Patient also with a history of dementia. Patient slowly declining over the past few months. Patient's wife recently died one day prior to admission.  patient was seen in consultation by palliative care and patient was noted to deteriorate during the hospitalization. Patient minimally responsive. Patient also noted to be easily agitated and continuing to decline physically, functionally and cognitively. Palliative care met with the family and the decision was made full for comfort measures. Patient be discharged to a residential  hospice home.     Procedures:  EEG 09/21/2016  Chest x-ray 09/20/2016  Plain films of the pelvis 09/20/2016  Limited MRI brain 09/20/2016  CT head CT C-spine 09/20/2016  Consultations:  Neurology: Dr.Oster 09/20/2016  Palliative care  Discharge Exam: Vitals:   09/22/16 0951 09/22/16 1358  BP: (!) 179/109 (!) 147/80  Pulse: 82 89  Resp: 20 20  Temp: 98.9 F (37.2 C) 99.1 F (37.3 C)    General: Minimally responsive Cardiovascular: RRR Respiratory: Decreased BS in bases.  Discharge Instructions   Discharge Instructions    Diet general    Complete by:  As directed    Comfort feeds.   Increase activity slowly    Complete by:  As directed      Current Discharge Medication List    START taking these medications   Details  bisacodyl (DULCOLAX) 10 MG suppository Place 1 suppository (10 mg total) rectally daily as needed for mild constipation. Qty: 12 suppository, Refills: 0    LORazepam (ATIVAN) 2 MG tablet Take 1 tablet (2 mg total) by mouth every 4 (four) hours as needed for anxiety. Qty: 20 tablet, Refills: 0    Morphine Sulfate (MORPHINE CONCENTRATE) 10 MG/0.5ML SOLN concentrated solution Take 0.25 mLs (5 mg total) by mouth every hour as needed for moderate pain or shortness of breath. Qty: 30 mL, Refills: 0    ondansetron (ZOFRAN) 4 MG tablet Take 1 tablet (4 mg total) by mouth every 6 (six) hours as needed for nausea. Qty: 20 tablet, Refills: 0  scopolamine (TRANSDERM-SCOP) 1 MG/3DAYS Place 1 patch (1.5 mg total) onto the skin every 3 (three) days. Qty: 10 patch, Refills: 12      CONTINUE these medications which have NOT CHANGED   Details  albuterol (PROVENTIL HFA;VENTOLIN HFA) 108 (90 Base) MCG/ACT inhaler Inhale 2 puffs into the lungs every 6 (six) hours as needed for wheezing or shortness of breath.     furosemide (LASIX) 20 MG tablet Take 20 mg by mouth daily.    tamsulosin (FLOMAX) 0.4 MG CAPS capsule Take 0.4 mg by mouth every  evening.      STOP taking these medications     Cholecalciferol (VITAMIN D PO)      clonazePAM (KLONOPIN) 0.5 MG tablet      Cyanocobalamin (VITAMIN B-12 PO)      famotidine (PEPCID) 20 MG tablet      ferrous sulfate 325 (65 FE) MG tablet      levothyroxine (SYNTHROID, LEVOTHROID) 125 MCG tablet      metFORMIN (GLUCOPHAGE) 500 MG tablet      NON FORMULARY      simvastatin (ZOCOR) 20 MG tablet        No Known Allergies Follow-up Information    HOSPICE MD Follow up.   Why:  F/U WITH HOSPICE MD           The results of significant diagnostics from this hospitalization (including imaging, microbiology, ancillary and laboratory) are listed below for reference.    Significant Diagnostic Studies: Dg Chest 1 View  Result Date: 09/20/2016 CLINICAL DATA:  Fall, stroke-like symptoms EXAM: CHEST 1 VIEW COMPARISON:  09/16/2016 FINDINGS: Cardiomediastinal silhouette is stable. Large hiatal hernia again noted. Central vascular congestion and probable chronic interstitial prominence again noted. Mild degenerative changes bilateral shoulders. No definite superimposed infiltrate or pulmonary edema. Mild basilar atelectasis. IMPRESSION: Large hiatal hernia again noted. Central vascular congestion and probable chronic interstitial prominence again noted. Mild degenerative changes bilateral shoulders. No definite superimposed infiltrate or pulmonary edema. Electronically Signed   By: Lahoma Crocker M.D.   On: 09/20/2016 12:46   Dg Pelvis 1-2 Views  Result Date: 09/20/2016 CLINICAL DATA:  Stroke.  Found on the floor. EXAM: PELVIS - 1-2 VIEW COMPARISON:  Pelvis CT dated 11/30/2007. FINDINGS: Normal appearing pelvic bones and hips. No fracture or dislocation seen. Lower lumbar spine degenerative changes and scoliosis. Atheromatous arterial calcifications, including the abdominal aorta. IMPRESSION: No acute abnormality.  Aortic atherosclerosis. Electronically Signed   By: Claudie Revering M.D.   On:  09/20/2016 12:46   Ct Head Wo Contrast  Result Date: 09/20/2016 CLINICAL DATA:  83 year old male with slurred speech. Left-sided weakness. Fall. Dementia. Initial encounter. EXAM: CT HEAD WITHOUT CONTRAST CT CERVICAL SPINE WITHOUT CONTRAST TECHNIQUE: Multidetector CT imaging of the head and cervical spine was performed following the standard protocol without intravenous contrast. Multiplanar CT image reconstructions of the cervical spine were also generated. COMPARISON:  09/16/2016 head CT. 04/06/2016 brain MR. 07/06/2011 plain film exam of the cervical spine. FINDINGS: CT HEAD FINDINGS Brain: No intracranial hemorrhage or CT evidence of large acute infarct. Remote inferior right cerebellar infarct. Moderate chronic microvascular changes. Moderate global atrophy without hydrocephalus. No intracranial mass lesion noted on this unenhanced exam. Vascular: Vascular calcifications. Skull: No skull fracture. Sinuses/Orbits: Post lens replacement without acute orbital abnormality. Mild mucosal thickening ethmoid sinus air cells bilaterally. Other: Negative. CT CERVICAL SPINE FINDINGS Alignment: Mild curvature.  Minimal anterior slip C2 and C7. Skull base and vertebrae: No cervical spine fracture Soft tissues and spinal  canal: No abnormal prevertebral soft tissue swelling. Disc levels: Cervical spondylotic changes most notable C4-5 thru C6-7. Upper chest: Scarring. Other: Carotid bifurcation calcifications. IMPRESSION: CT HEAD No skull fracture or intracranial hemorrhage. No CT evidence of large acute infarct. Remote inferior right cerebellar infarct. Moderate chronic microvascular changes. Moderate global atrophy without hydrocephalus. CT CERVICAL SPINE No cervical spine fracture or abnormal prevertebral soft tissue swelling. Cervical spondylotic changes most notable C4-5 thru C6-7. Electronically Signed   By: Genia Del M.D.   On: 09/20/2016 13:16   Ct Cervical Spine Wo Contrast  Result Date: 09/20/2016 CLINICAL  DATA:  83 year old male with slurred speech. Left-sided weakness. Fall. Dementia. Initial encounter. EXAM: CT HEAD WITHOUT CONTRAST CT CERVICAL SPINE WITHOUT CONTRAST TECHNIQUE: Multidetector CT imaging of the head and cervical spine was performed following the standard protocol without intravenous contrast. Multiplanar CT image reconstructions of the cervical spine were also generated. COMPARISON:  09/16/2016 head CT. 04/06/2016 brain MR. 07/06/2011 plain film exam of the cervical spine. FINDINGS: CT HEAD FINDINGS Brain: No intracranial hemorrhage or CT evidence of large acute infarct. Remote inferior right cerebellar infarct. Moderate chronic microvascular changes. Moderate global atrophy without hydrocephalus. No intracranial mass lesion noted on this unenhanced exam. Vascular: Vascular calcifications. Skull: No skull fracture. Sinuses/Orbits: Post lens replacement without acute orbital abnormality. Mild mucosal thickening ethmoid sinus air cells bilaterally. Other: Negative. CT CERVICAL SPINE FINDINGS Alignment: Mild curvature.  Minimal anterior slip C2 and C7. Skull base and vertebrae: No cervical spine fracture Soft tissues and spinal canal: No abnormal prevertebral soft tissue swelling. Disc levels: Cervical spondylotic changes most notable C4-5 thru C6-7. Upper chest: Scarring. Other: Carotid bifurcation calcifications. IMPRESSION: CT HEAD No skull fracture or intracranial hemorrhage. No CT evidence of large acute infarct. Remote inferior right cerebellar infarct. Moderate chronic microvascular changes. Moderate global atrophy without hydrocephalus. CT CERVICAL SPINE No cervical spine fracture or abnormal prevertebral soft tissue swelling. Cervical spondylotic changes most notable C4-5 thru C6-7. Electronically Signed   By: Genia Del M.D.   On: 09/20/2016 13:16   Mr Brain Wo Contrast  Result Date: 09/20/2016 CLINICAL DATA:  Initial evaluation for acute seizure. EXAM: MRI HEAD WITHOUT CONTRAST  TECHNIQUE: Multiplanar, multiecho pulse sequences of the brain and surrounding structures were obtained without intravenous contrast. COMPARISON:  Prior CT from 09/20/2016. FINDINGS: Brain: Study severely limited as the patient was unable to tolerate the full length of the exam. Only diffusion-weighted sequences and axial FLAIR sequence was performed. Additionally, images provided are fairly degraded by motion artifact. Diffusion-weighted imaging demonstrates no evidence for acute infarct. Gray-white matter differentiation grossly maintained. No other definite acute intracranial process. Cerebral volume within normal limits for age. No significant cerebral white matter disease for age identified. Remote right cerebellar infarct noted. No mass lesion, midline shift or mass effect. No hydrocephalus. No extra-axial fluid collection. Vascular: Grossly maintained, but not well evaluated on this limited exam. Skull and upper cervical spine: Grossly unremarkable, but not well evaluated on this limited exam. Sinuses/Orbits: Globes and oval soft tissues grossly within normal limits. Paranasal sinuses grossly clear. Other: No other significant finding. IMPRESSION: 1. Limited study as the patient was unable to tolerate the full length of the exam. Additionally, the images provided are markedly degraded by motion artifact. 2. No acute intracranial infarct. No other definite acute intracranial process. 3. Remote right inferior cerebellar infarct. 4. If there is high clinical suspicion for a possible occult intracranial process. Repeat imaging when the patient is able to tolerate the study and hold  still is suggested. Electronically Signed   By: Jeannine Boga M.D.   On: 09/20/2016 21:14   Dg Chest Port 1 View  Result Date: 09/22/2016 CLINICAL DATA:  Encephalopathy. EXAM: PORTABLE CHEST 1 VIEW COMPARISON:  09/20/2016.  09/16/2016. FINDINGS: Cardiomegaly with pulmonary vascular prominence and interstitial prominence again  noted without significant change. No prominent pleural effusion. No pneumothorax. Hiatal hernia. Degenerative changes both shoulders. Old right rib fractures. IMPRESSION: Cardiomegaly with pulmonary vascular prominence and interstitial prominence again noted. Findings consistent with mild CHF. No significant change . Electronically Signed   By: Marcello Moores  Register   On: 09/22/2016 07:08    Microbiology: No results found for this or any previous visit (from the past 240 hour(s)).   Labs: Basic Metabolic Panel:  Recent Labs Lab 09/20/16 1220 09/20/16 1231 09/21/16 0920 09/22/16 0527  NA 142 142 141 140  K 3.5 3.4* 3.6 3.8  CL 104 100* 106 102  CO2 29  --  26 27  GLUCOSE 112* 107* 83 99  BUN _0 CREATININE 0.84 0.70 0.74 0.77  CALCIUM 8.9  --  8.4* 8.3*  MG  --   --  2.0  --    Liver Function Tests:  Recent Labs Lab 09/20/16 1220 09/21/16 0334  AST 27  --   ALT 12*  --   ALKPHOS 46  --   BILITOT 1.8*  --   PROT 6.2*  --   ALBUMIN 3.9 3.5   No results for input(s): LIPASE, AMYLASE in the last 168 hours. No results for input(s): AMMONIA in the last 168 hours. CBC:  Recent Labs Lab 09/20/16 1220 09/20/16 1231 09/22/16 0527  WBC 7.5  --  7.2  NEUTROABS 5.9  --  6.0  HGB 14.4 14.3 14.2  HCT 43.4 42.0 44.3  MCV 93.9  --  95.5  PLT 147*  --  163   Cardiac Enzymes:  Recent Labs Lab 09/20/16 1220 09/21/16 0334 09/22/16 1002  CKTOTAL 509* 657* 386   BNP: BNP (last 3 results) No results for input(s): BNP in the last 8760 hours.  ProBNP (last 3 results) No results for input(s): PROBNP in the last 8760 hours.  CBG:  Recent Labs Lab 09/21/16 0945 09/21/16 1147 09/21/16 1636 09/21/16 2155 09/22/16 0621  GLUCAP 71 76 79 91 97       Signed:  Tuwanda Vokes MD.  [facility] 09/22/2016, 2:52 PM

## 2016-09-23 LAB — URINE CULTURE

## 2016-09-26 DIAGNOSIS — K117 Disturbances of salivary secretion: Secondary | ICD-10-CM

## 2016-09-26 DIAGNOSIS — R451 Restlessness and agitation: Secondary | ICD-10-CM

## 2016-10-18 DEATH — deceased

## 2019-03-13 IMAGING — MR MR HEAD W/O CM
5 series · 48 of 48 positions shown · non-contrast
Comparison: Prior CT from 09/20/2016.

CLINICAL DATA: Initial evaluation for acute seizure.

EXAM:
MRI HEAD WITHOUT CONTRAST
TECHNIQUE: Multiplanar, multiecho pulse sequences of the brain and surrounding
structures were obtained without intravenous contrast.

[Series 6: DWI · axial · 3.0mm · 0.94mm/px · z∈[-36,+103]mm · 17 of 100 slices shown (1 of 2)]
[im 1/100]
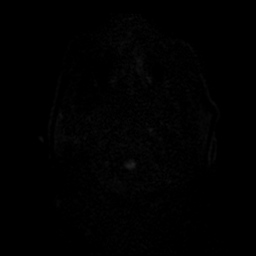
[im 7/100]
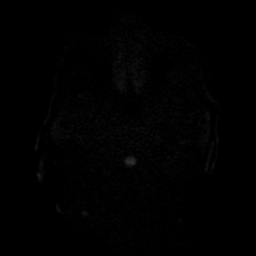
[im 13/100]
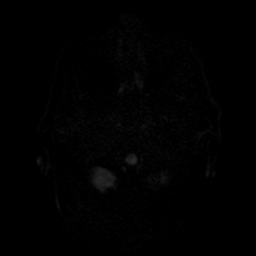
[im 19/100]
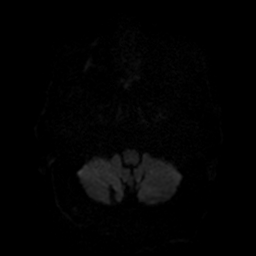
[im 25/100]
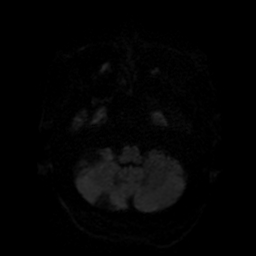
[im 31/100]
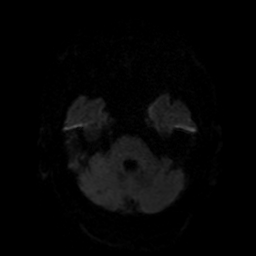
[im 38/100]
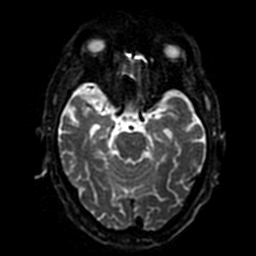
[im 44/100]
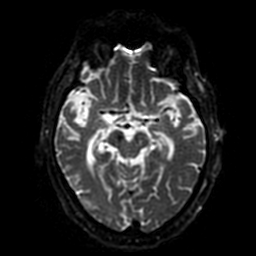
[im 50/100]
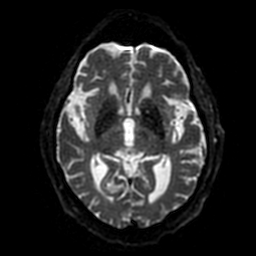
[im 56/100]
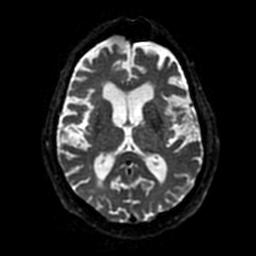
[im 62/100]
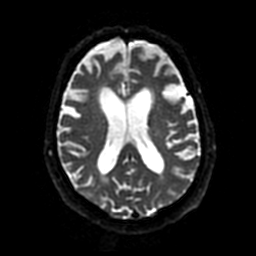
[im 69/100]
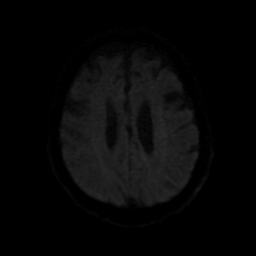
[im 75/100]
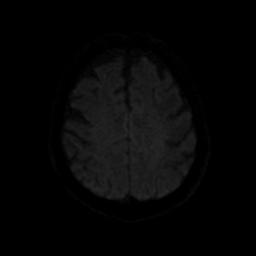
[im 81/100]
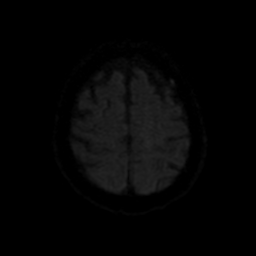
[im 87/100]
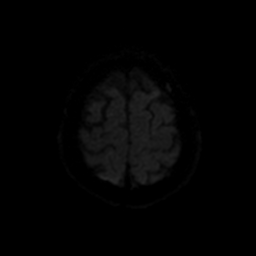
[im 93/100]
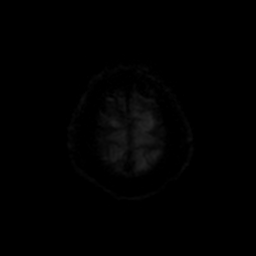
[im 100/100]
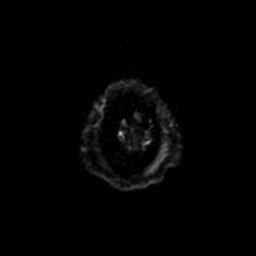

[Series 7: DWI · coronal · 4.0mm · 0.94mm/px · 12 of 68 slices shown (2 of 2)]
[im 1/68]
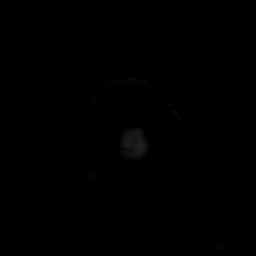
[im 7/68]
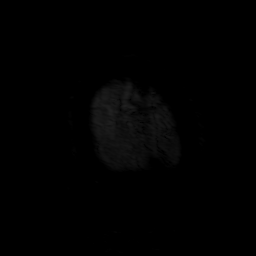
[im 13/68]
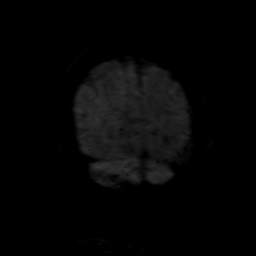
[im 19/68]
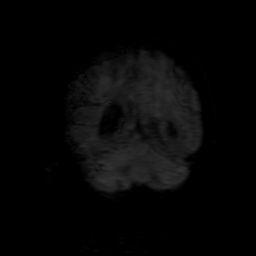
[im 25/68]
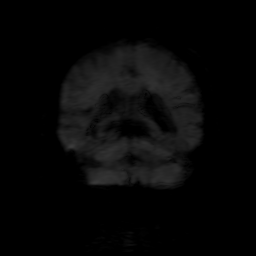
[im 31/68]
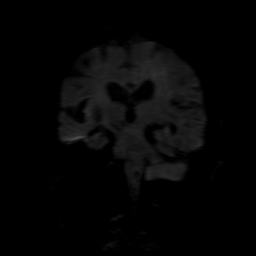
[im 37/68]
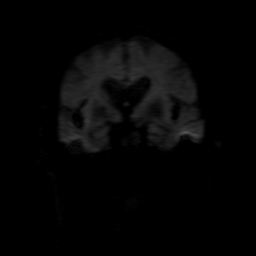
[im 43/68]
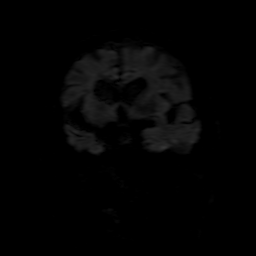
[im 49/68]
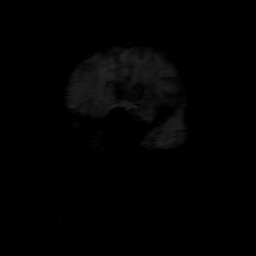
[im 55/68]
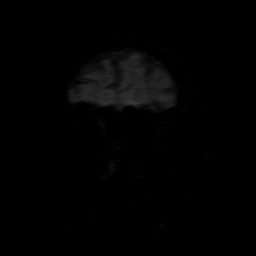
[im 61/68]
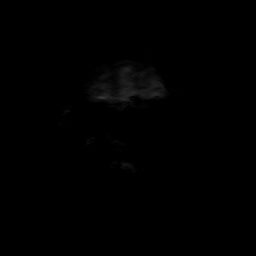
[im 68/68]
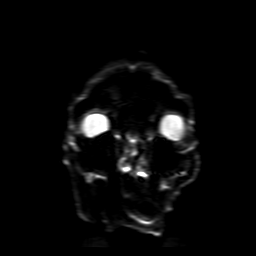

[Series 10: FLAIR · axial · 5.0mm · 0.47mm/px · z∈[-43,+92]mm · 4 of 25 slices shown]
[im 1/25]
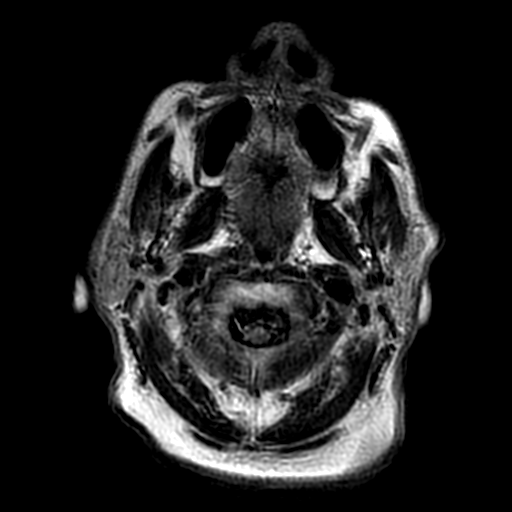
[im 9/25]
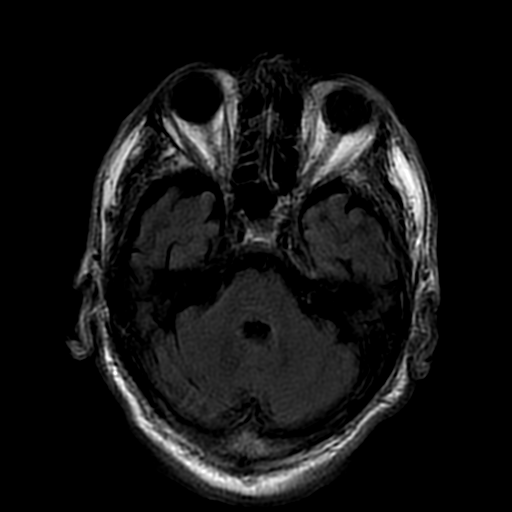
[im 17/25]
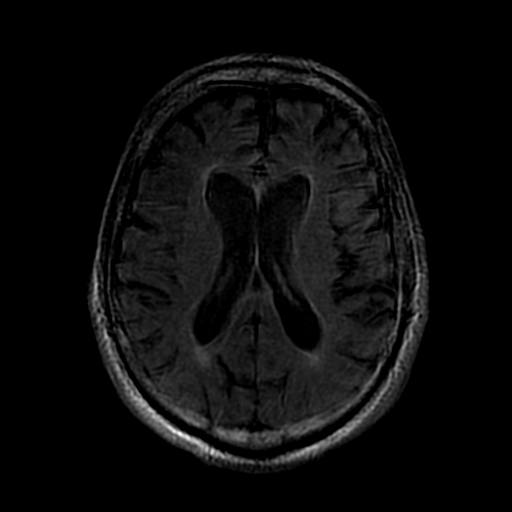
[im 25/25]
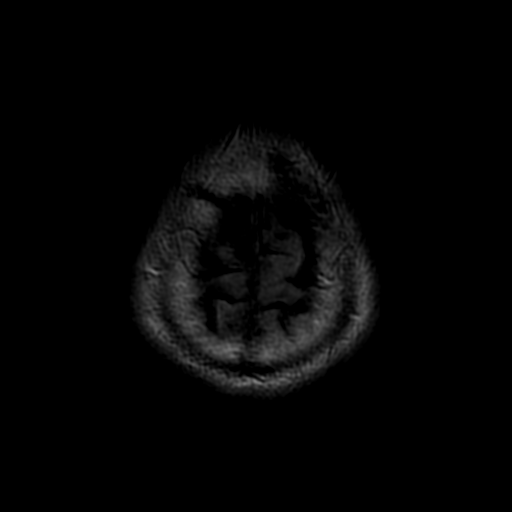

[Series 650: ADC · axial · 3.0mm · 0.94mm/px · z∈[-36,+103]mm · 9 of 50 slices shown (1 of 2)]
[im 1/50]
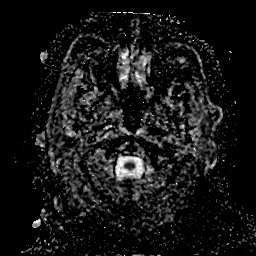
[im 7/50]
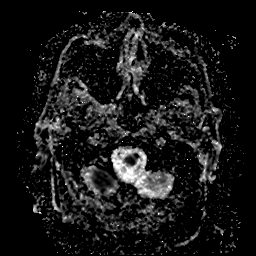
[im 13/50]
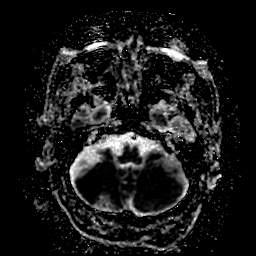
[im 19/50]
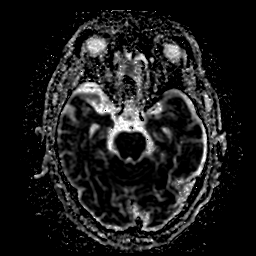
[im 25/50]
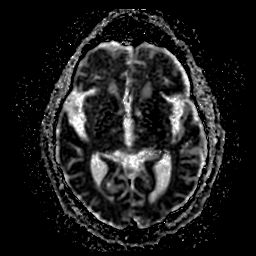
[im 31/50]
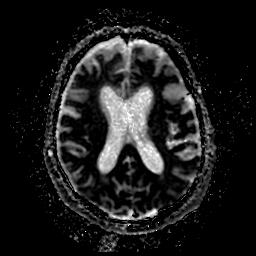
[im 37/50]
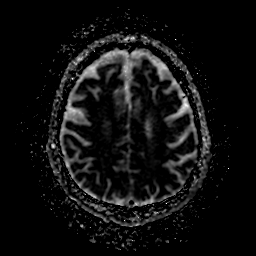
[im 43/50]
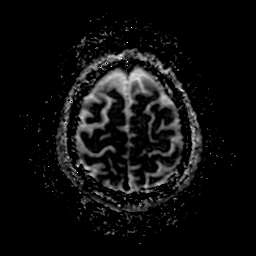
[im 50/50]
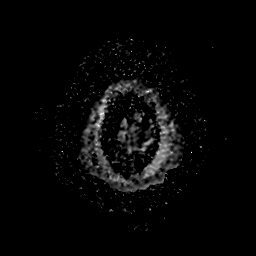

[Series 750: ADC · coronal · 4.0mm · 0.94mm/px · 6 of 34 slices shown (2 of 2)]
[im 1/34]
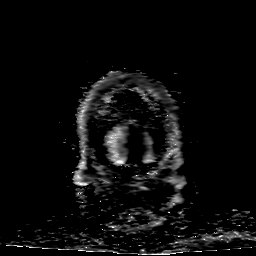
[im 7/34]
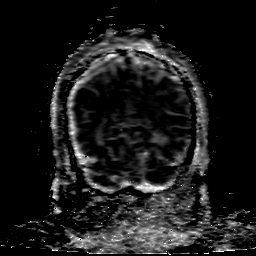
[im 14/34]
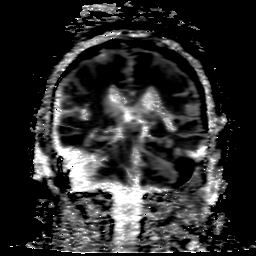
[im 20/34]
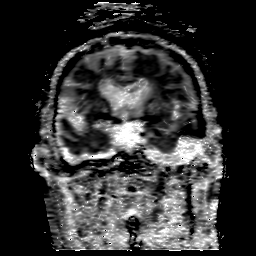
[im 27/34]
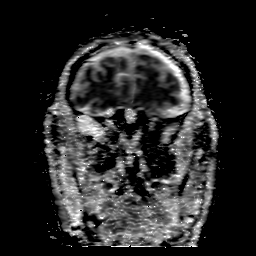
[im 34/34]
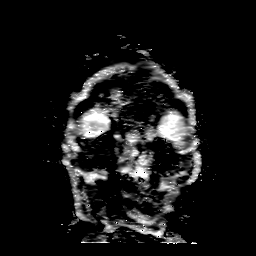

[48 of 48 positions shown; findings below may reference images not displayed]

FINDINGS: Brain: Study severely limited as the patient was unable to tolerate
the full length of the exam. Only diffusion-weighted sequences and
axial FLAIR sequence was performed. Additionally, images provided
are fairly degraded by motion artifact.

Diffusion-weighted imaging demonstrates no evidence for acute
infarct. Gray-white matter differentiation grossly maintained.

No other definite acute intracranial process. Cerebral volume within
normal limits for age. No significant cerebral white matter disease
for age identified. Remote right cerebellar infarct noted. No mass
lesion, midline shift or mass effect. No hydrocephalus. No
extra-axial fluid collection.

Vascular: Grossly maintained, but not well evaluated on this limited
exam.

Skull and upper cervical spine: Grossly unremarkable, but not well
evaluated on this limited exam.

Sinuses/Orbits: Globes and oval soft tissues grossly within normal
limits. Paranasal sinuses grossly clear.

Other: No other significant finding.
IMPRESSION: 1. Limited study as the patient was unable to tolerate the full
length of the exam. Additionally, the images provided are markedly
degraded by motion artifact.
2. No acute intracranial infarct. No other definite acute
intracranial process.
3. Remote right inferior cerebellar infarct.
4. If there is high clinical suspicion for a possible occult
intracranial process. Repeat imaging when the patient is able to
tolerate the study and hold still is suggested.
